# Patient Record
Sex: Female | Born: 1990 | Race: Black or African American | Hispanic: No | Marital: Single | Smoking: Never smoker
Health system: Southern US, Community
[De-identification: ages and names within clinical notes are randomized; demographics above are authoritative.]

## PROBLEM LIST (undated history)

## (undated) ENCOUNTER — Inpatient Hospital Stay (HOSPITAL_COMMUNITY): Payer: Self-pay

## (undated) DIAGNOSIS — Q613 Polycystic kidney, unspecified: Secondary | ICD-10-CM

## (undated) DIAGNOSIS — N200 Calculus of kidney: Secondary | ICD-10-CM

## (undated) HISTORY — DX: Calculus of kidney: N20.0

## (undated) HISTORY — PX: NO PAST SURGERIES: SHX2092

## (undated) HISTORY — DX: Polycystic kidney, unspecified: Q61.3

---

## 2017-04-05 NOTE — L&D Delivery Note (Addendum)
Patient: Kimberly Colon MRN: [id number]  GBS status: negative, IAP given NA  Patient is a 27 y.o. now G2P1011 s/p NSVD at [redacted]w[redacted]d, who was admitted for IOL 2/2 GHTN. SROM 2h 18m prior to delivery with clear fluid.    Delivery Note At 4:04 PM a viable female was delivered via Vaginal, Spontaneous (Presentation:vertex;OA).  APGAR: 8, 8; weight pending.   Placenta status:delivered spontaneously,intact.  Cord: 3-vessel with the following complications:none.  Cord pH: pending  Anesthesia:  Epidural inplace Episiotomy: None Lacerations: Labial Suture Repair: none required Est. Blood Loss (mL): 333  Head delivered OA. No nuchal cord present. Shoulder and body delivered in usual fashion. Infant with spontaneous cry, placed on mother's abdomen, dried and bulb suctioned. Cord clamped x 2 after 1-minute delay, and cut by family member. Cord blood drawn. Placenta delivered spontaneously with gentle cord traction. Fundus firm with massage and Pitocin. Perineum inspected and found to have minor shallow lacerations to labia which were found to be hemostatic- not requiring suture.   Mom to postpartum.  Baby to Couplet care / Skin to Skin.  Chelsey Geanie Berlin 03/20/2018, 4:29 PM   OB FELLOW DELIVERY ATTESTATION  I was gloved and present for the delivery in its entirety, and I agree with the above resident's note.    Phill Myron, D.O. OB Fellow  03/20/2018, 11:01 PM

## 2017-09-06 ENCOUNTER — Encounter: Payer: Self-pay | Admitting: *Deleted

## 2017-09-21 ENCOUNTER — Encounter: Payer: Self-pay | Admitting: Obstetrics and Gynecology

## 2017-09-21 ENCOUNTER — Other Ambulatory Visit (HOSPITAL_COMMUNITY)
Admission: RE | Admit: 2017-09-21 | Discharge: 2017-09-21 | Disposition: A | Discharge status: Home / Self Care | Payer: Medicaid Other | Source: Ambulatory Visit | Attending: Obstetrics and Gynecology | Admitting: Obstetrics and Gynecology

## 2017-09-21 ENCOUNTER — Ambulatory Visit (INDEPENDENT_AMBULATORY_CARE_PROVIDER_SITE_OTHER): Payer: Medicaid Other | Admitting: Obstetrics and Gynecology

## 2017-09-21 VITALS — BP 125/71 | HR 97 | Ht 67.0 in | Wt 152.1 lb

## 2017-09-21 DIAGNOSIS — Z3492 Encounter for supervision of normal pregnancy, unspecified, second trimester: Secondary | ICD-10-CM

## 2017-09-21 DIAGNOSIS — Z3490 Encounter for supervision of normal pregnancy, unspecified, unspecified trimester: Secondary | ICD-10-CM | POA: Diagnosis present

## 2017-09-21 DIAGNOSIS — O0992 Supervision of high risk pregnancy, unspecified, second trimester: Secondary | ICD-10-CM

## 2017-09-21 DIAGNOSIS — Z3481 Encounter for supervision of other normal pregnancy, first trimester: Secondary | ICD-10-CM | POA: Diagnosis not present

## 2017-09-21 DIAGNOSIS — Q613 Polycystic kidney, unspecified: Secondary | ICD-10-CM | POA: Insufficient documentation

## 2017-09-21 DIAGNOSIS — O0990 Supervision of high risk pregnancy, unspecified, unspecified trimester: Secondary | ICD-10-CM | POA: Insufficient documentation

## 2017-09-21 MED ORDER — ASPIRIN EC 81 MG PO TBEC: 81.0000 mg | DELAYED_RELEASE_TABLET | Freq: Every day | ORAL | 2 refills | Status: DC

## 2017-09-21 MED ORDER — MICONAZOLE NITRATE 2 % VA CREA: 1.0000 | TOPICAL_CREAM | Freq: Every day | VAGINAL | 2 refills | Status: DC

## 2017-09-21 NOTE — Progress Notes (Signed)
New OB Note  09/21/2017   Clinic: [facility]  Chief Complaint: NOB  Transfer of Care Patient: no  History of Present Illness: Ms. Severs is a 27 y.o. G2P0010 @ 12/5 weeks ([place], tentative at 12/27, based on Patient's last menstrual period was 06/24/2017 (exact date).).  Preg complicated by has Polycystic kidney disease and Supervision of high risk pregnancy, antepartum on their problem list.   Any events prior to today's visit: no Her periods were: qmonth, regular She was using no method when she conceived.  She has Positive signs or symptoms of nausea/vomiting of pregnancy. She has Negative signs or symptoms of miscarriage or preterm labor On any medications around the time she conceived/early pregnancy: No  ROS: A 12-point review of systems was performed and negative, except as stated in the above HPI.  OBGYN History: As per HPI. OB History  Gravida Para Term Preterm AB Living  2       1    SAB TAB Ectopic Multiple Live Births    1          # Outcome Date GA Lbr Len/2nd Weight Sex Delivery Anes PTL Lv  2 Current           1 TAB             Obstetric Comments  G1: 2016 EAB, pills    Any issues with any prior pregnancies: not applicable Prior children are healthy, doing well, and without any problems or issues: not applicable History of pap smears: No.   Past Medical History: Past Medical History:  Diagnosis Date  . Polycystic kidney disease   . Renal stone     Past Surgical History: History reviewed. No pertinent surgical history.  Family History:  History reviewed. No pertinent family history. She denies any female cancers, bleeding or blood clotting disorders.  She denies any history of mental retardation, birth defects or genetic disorders in her or the FOB's history except for her PCKD in the family  Social History:  Social History   Socioeconomic History  . Marital status: Single    Spouse name: Not on file  . Number of children:  Not on file  . Years of education: Not on file  . Highest education level: Not on file  Occupational History  . Not on file  Social Needs  . Financial resource strain: Not on file  . Food insecurity:    Worry: Not on file    Inability: Not on file  . Transportation needs:    Medical: Not on file    Non-medical: Not on file  Tobacco Use  . Smoking status: Not on file  Substance and Sexual Activity  . Alcohol use: Not on file  . Drug use: Not on file  . Sexual activity: Not on file  Lifestyle  . Physical activity:    Days per week: Not on file    Minutes per session: Not on file  . Stress: Not on file  Relationships  . Social connections:    Talks on phone: Not on file    Gets together: Not on file    Attends religious service: Not on file    Active member of club or organization: Not on file    Attends meetings of clubs or organizations: Not on file    Relationship status: Not on file  . Intimate partner violence:    Fear of current or ex partner: Not on file    Emotionally abused: Not  on file    Physically abused: Not on file    Forced sexual activity: Not on file  Other Topics Concern  . Not on file  Social History Narrative  . Not on file    Allergy: No Known Allergies  Health Maintenance:  Mammogram Up to Date: not applicable  Current Outpatient Medications: PNV, amoxicillin  Physical Exam:   BP 125/71   Pulse 97   Ht 5\' 7"  (1.702 m)   Wt 152 lb 1.6 oz (69 kg)   LMP 06/24/2017 (Exact Date)   BMI 23.82 kg/m  Body mass index is 23.82 kg/m. Contractions: Not present Vag. Bleeding: None. Fundal height: not applicable FHTs: 756E  General appearance: Well nourished, well developed female in no acute distress.  Neck:  Supple, normal appearance, and no thyromegaly  Cardiovascular: S1, S2 normal, no murmur, rub or gallop, regular rate and rhythm Respiratory:  Clear to auscultation bilateral. Normal respiratory effort Abdomen: positive bowel sounds and  no masses, hernias; diffusely non tender to palpation, non distended Breasts: pt denies any breast s/s Neuro/Psych:  Normal mood and affect.  Skin:  Warm and dry.  Lymphatic:  No inguinal lymphadenopathy.   Pelvic exam: is not limited by body habitus EGBUS: white cottage cheese like d/c and erythema on b/l labia majora, Vagina: +white CC d/c in vault, no blood in the vault, Cervix: normal appearing cervix without discharge or lesions, closed/long/high, Uterus:  enlarged, c/w 12-14 week size, and Adnexa:  normal adnexa and no mass, fullness, tenderness  Laboratory: none  Imaging:  none  Assessment: pt doing well  Plan: 1. Polycystic kidney disease Pt has questions re: this and has never seen genetics. [facility] notes reviewed.k will refer to genetics. D/w her increased risk of pre-eclampsia with it. Will get baseline labs today. F/u anatomy u/s. Start low dose aspirin today.  - Protein / creatinine ratio, urine - Comprehensive metabolic panel - AMB MFM GENETICS REFERRAL - Protein, urine, 24 hour - Creatinine, urine, 24 hour; Future - [place] MFM OB DETAIL +14 WK; Future  2. Encounter for supervision of low-risk pregnancy, antepartum - Culture, OB Urine - Cystic fibrosis gene test - Cytology - PAP - Genetic Screening - Hemoglobinopathy Evaluation - Obstetric Panel, Including HIV - SMN1 COPY NUMBER ANALYSIS (SMA Carrier Screen) - [place] MFM OB COMP + 14 WK; Future  3. Supervision of high risk pregnancy, antepartum Set up for anatomy u/s at 18-20wks. Monistat 7 sent in. Like from the amox she got for her strep throat. Offer afp nv. For panorama today. Finalize dating once she has an u/s. - Protein / creatinine ratio, urine - Comprehensive metabolic panel - AMB MFM GENETICS REFERRAL - Protein, urine, 24 hour - Creatinine, urine, 24 hour; Future - [place] MFM OB DETAIL +14 WK; Future  Problem list reviewed and updated.  Follow up in 3 weeks.  >50% of 25 min visit spent on counseling and  coordination of care.    Durene Romans MD Attending [facility])

## 2017-09-22 LAB — PROTEIN / CREATININE RATIO, URINE
CREATININE, UR: 83.5 mg/dL
PROTEIN UR: 5.3 mg/dL
Protein/Creat Ratio: 63 mg/g creat (ref 0–200)

## 2017-09-23 ENCOUNTER — Ambulatory Visit (HOSPITAL_COMMUNITY)
Admission: RE | Admit: 2017-09-23 | Discharge: 2017-09-23 | Disposition: A | Discharge status: Home / Self Care | Payer: Medicaid Other | Source: Ambulatory Visit | Attending: Obstetrics and Gynecology | Admitting: Obstetrics and Gynecology

## 2017-09-23 DIAGNOSIS — O352XX1 Maternal care for (suspected) hereditary disease in fetus, fetus 1: Secondary | ICD-10-CM

## 2017-09-23 DIAGNOSIS — Z3A13 13 weeks gestation of pregnancy: Secondary | ICD-10-CM | POA: Insufficient documentation

## 2017-09-23 LAB — CYTOLOGY - PAP
CHLAMYDIA, DNA PROBE: NEGATIVE
Diagnosis: NEGATIVE
NEISSERIA GONORRHEA: NEGATIVE

## 2017-09-23 LAB — URINE CULTURE, OB REFLEX

## 2017-09-23 LAB — CULTURE, OB URINE

## 2017-09-23 NOTE — Progress Notes (Signed)
Genetic Counseling  Visit Summary Note  Appointment Date: 09/23/2017 Referred By: Aletha Halim, MD  Date of Birth: 1990-11-29  Pregnancy history: G2P0010 Estimated Date of Delivery: 03/31/18 Estimated Gestational Age: [redacted]w[redacted]d I met with Ms. JObera Stauchfor genetic counseling because of a family history of polycystic kidney disease.  In summary:  Discussed family history of probable ADPKD  Reviewed ADPKD and dominant inheritance  Discussed 50% risk of recurrence  Discussed options of genetic testing for ADPKD  Offered PKD1 and PKD2 analysis-declined   Discussed option of prenatal diagnosis once a familial mutation is determined  Patient expressed that she would not be interested in amniocentesis even if a familial mutation was known  Discussed that fetuses with ADPKD rarely have cystic kidneys; a normal anatomy ultrasound therefore would not decrease the likelihood of ADPKD in the fetus  Reviewed routine screening for aneuploidy options- patient previously had NIPS (Panorama); results were pending at the time of today's appt.  Discussed general population carrier screening options  CF, SMA, and hemoglobinopathies-previously performed by referring provider; results pending at the time of today's appt  We began by reviewing the family history in detail. The [facility] setting of [facility] fever [facility]. CT of the abdomen at that time revealed bilateral polycystic kidneys, which was felt to be consistent with polycystic kidney disease (PKD). She was thought to have an infection of one of the renal cysts and symptoms improved with antibiotics. [facility] the [facility] She reported that she has been well since her  hospitalization in 2016.  The family histories were reviewed and found to be contributory for PKD. She reported that her mother has a history of PKD and is currently awaiting renal transplantation following a lengthy period on dialysis. The patient reported that her maternal grandfather, great uncle, and maternal great grandmother also have PKD. All of these relatives have undergone renal transplantation during their 434's The patient reported that she has two maternal half-siblings, who both had screening renal ultrasounds, given the family history, who are currently asymptomatic, but both have bilateral renal cysts. The remainder of both family histories were noncontributory for birth defects, intellectual disability, and known genetic conditions. Without further information regarding the provided family history, an accurate genetic risk cannot be calculated. Further genetic counseling is warranted if more information is obtained.  We discussed that the reported family history likely fits with autosomal dominant polycystic kidney disease (ADPKD), which is typically associated with onset of features during adulthood. The less common autosomal recessive form of PKD is typically associated with early onset. PKD is a common genetic disorder in adulthood with an incidence of about 1 in 1000. It is a systemic disease that is associated with renal system failure, liver disease, cerebral aneurysm, portal hypertension, dissection of the aorta and other heart problems.  PKD is generally a late-onset disorder characterized by progressive cyst development and bilaterally enlarged polycystic kidneys. These cysts can slowly replace much of the mass of the kidneys, reducing kidney function and leading to kidney failure. The condition is clinically variable, even within families.  Although rare, autosomal dominant PKD may present prenatally or in infancy. About 85% of cases of ADPKD result from an altered gene located on  chromosome 16 (PKD1).  The remaining 15% of cases are thought to result from an altered gene on chromosome 4 (PKD2). Genetic testing is clinically available but is most informative when began with individuals with a clinical diagnosis.   The diagnosis of autosomal dominant polycystic kidney disease is established primarily by renal imaging studies and genetic testing can be used to confirm or establish an uncertain diagnosis.  Diagnostic criteria has been established for renal sonography and it is thought that almost all (nearly 100%) of patients with the more common type of autosomal dominant PKD (PKD1) will meet this criteria by the age of 35 years or older.  We reviewed autosomal dominant inheritance where offspring of an affected individual have a 1 in 2 (50%) chance to inherit the condition. We discussed that given the reported family history, each of Kimberly Colon children will have a 50% chance of inheriting ADPKD. We discussed the option of confirmatory PKD1/PKD2 molecular genetic testing. Although the patient meets diagnostic criteria for ADPKD, if a specific gene alteration is identified, prenatal diagnosis would be available. We reviewed the option of amniocentesis, including the risks, benefits, and limitations of this testing. After thoughtful consideration of her options, the patient declined both molecular genetic testing for herself, and amniocentesis for prenatal diagnosis. She understands that the majority of fetuses with ADPKD will have normal appearing kidneys by detailed anatomy ultrasound. Therefore, a normal ultrasound does not reduce the likelihood of ADPKD in the fetus.   I reminded Kimberly Colon that although ADPKD is primarily associated with renal disease, this condition can affect other organ systems. I encouraged the patient to see a nephrologist and to discuss this condition in greater detail. She stated that she would call to reschedule with nephrology at [facility]. Without further  information regarding the provided family history, an accurate genetic risk cannot be calculated. Further genetic counseling is warranted if more information is obtained.  We also briefly reviewed general pregnancy screening options. Regarding screening for fetal aneuploidy, we discussed the age related risks and briefly reviewed chromosomes. The patient had NIPS (Panorama) on 09/21/17. The results were pending at the time of today's appointment. She is scheduled to have a detailed anatomy ultrasound on 11/09/17.   In addition, we reviewed the availability of screening for common autosomal recessive conditions, including: CF, SMA, and hemoglobinopathies. The patient had this screening performed through her referring provider's office on 09/21/17. The results were pending at the time of today's appointment.   Kimberly Colon denied exposure to environmental toxins or chemical agents. She denied the use of alcohol, tobacco or street drugs. She denied significant viral illnesses during the course of her pregnancy. Her medical and surgical histories were noncontributory.   I counseled this patient regarding the above risks and available options.  The approximate face-to-face time with the genetic counselor was 43 minutes.  Filbert Schilder, MS Certified Genetic Counselor

## 2017-09-24 LAB — PROTEIN, URINE, 24 HOUR
Protein, 24H Urine: 121 mg/24 hr (ref 30–150)
Protein, Ur: 8.2 mg/dL

## 2017-09-28 LAB — COMPREHENSIVE METABOLIC PANEL
ALT: 12 IU/L (ref 0–32)
AST: 12 IU/L (ref 0–40)
Albumin/Globulin Ratio: 1.6 (ref 1.2–2.2)
Albumin: 4.1 g/dL (ref 3.5–5.5)
Alkaline Phosphatase: 44 IU/L (ref 39–117)
BUN/Creatinine Ratio: 19 (ref 9–23)
BUN: 9 mg/dL (ref 6–20)
Bilirubin Total: <0.2 mg/dL (ref 0.0–1.2)
CALCIUM: 9.7 mg/dL (ref 8.7–10.2)
CO2: 22 mmol/L (ref 20–29)
CREATININE: 0.48 mg/dL — AB (ref 0.57–1.00)
Chloride: 103 mmol/L (ref 96–106)
GFR calc Af Amer: 158 mL/min/{1.73_m2} (ref >59)
GFR, EST NON AFRICAN AMERICAN: 137 mL/min/{1.73_m2} (ref >59)
GLOBULIN, TOTAL: 2.6 g/dL (ref 1.5–4.5)
Glucose: 78 mg/dL (ref 65–99)
POTASSIUM: 4.2 mmol/L (ref 3.5–5.2)
SODIUM: 140 mmol/L (ref 134–144)
Total Protein: 6.7 g/dL (ref 6.0–8.5)

## 2017-09-28 LAB — SMN1 COPY NUMBER ANALYSIS (SMA CARRIER SCREENING)

## 2017-09-28 LAB — OBSTETRIC PANEL, INCLUDING HIV
Antibody Screen: NEGATIVE
Basophils Absolute: 0 10*3/uL (ref 0.0–0.2)
Basos: 0 %
EOS (ABSOLUTE): 0.1 10*3/uL (ref 0.0–0.4)
EOS: 1 %
HEMOGLOBIN: 11.2 g/dL (ref 11.1–15.9)
HEP B S AG: NEGATIVE
HIV Screen 4th Generation wRfx: NONREACTIVE
Hematocrit: 32.9 % — ABNORMAL LOW (ref 34.0–46.6)
IMMATURE GRANS (ABS): 0 10*3/uL (ref 0.0–0.1)
IMMATURE GRANULOCYTES: 0 %
LYMPHS: 26 %
Lymphocytes Absolute: 2.1 10*3/uL (ref 0.7–3.1)
MCH: 29 pg (ref 26.6–33.0)
MCHC: 34 g/dL (ref 31.5–35.7)
MCV: 85 fL (ref 79–97)
MONOCYTES: 7 %
Monocytes Absolute: 0.6 10*3/uL (ref 0.1–0.9)
NEUTROS PCT: 66 %
Neutrophils Absolute: 5.2 10*3/uL (ref 1.4–7.0)
Platelets: 270 10*3/uL (ref 150–450)
RBC: 3.86 x10E6/uL (ref 3.77–5.28)
RDW: 15.4 % (ref 12.3–15.4)
RH TYPE: POSITIVE
RPR: NONREACTIVE
RUBELLA: 2.19 {index} (ref >0.99)
WBC: 7.9 10*3/uL (ref 3.4–10.8)

## 2017-09-28 LAB — HEMOGLOBINOPATHY EVALUATION
Ferritin: 60 ng/mL (ref 15–150)
HGB A: 97.6 % (ref 96.4–98.8)
HGB SOLUBILITY: NEGATIVE
Hgb A2 Quant: 2.4 % (ref 1.8–3.2)
Hgb C: 0 %
Hgb F Quant: 0 % (ref 0.0–2.0)
Hgb S: 0 %
Hgb Variant: 0 %

## 2017-09-28 LAB — CYSTIC FIBROSIS GENE TEST

## 2017-09-28 NOTE — Progress Notes (Signed)
09/28/17 Addendum: PMH completed

## 2017-10-12 ENCOUNTER — Encounter: Payer: Self-pay | Admitting: *Deleted

## 2017-10-31 ENCOUNTER — Telehealth: Payer: Self-pay | Admitting: General Practice

## 2017-10-31 NOTE — Telephone Encounter (Signed)
Patient called and left message on nurse voicemail line stating she has a follow up appt next Thursday but has had spotting with urination and her throat is really sore and swollen. Called patient and asked about bleeding. Patient reports seeing spotting once last night after she urinated but nothing since then. Told patient to keep an eye on things to make sure bleeding doesn't increase but decreased some spotting in early pregnancy can be common. Recommended she go to urgent care for evaluation of throat. Patient verbalized understanding & states she has had strep throat 2 times already in the past 2 months. Patient had no questions.

## 2017-11-01 ENCOUNTER — Emergency Department (HOSPITAL_COMMUNITY): Payer: 59

## 2017-11-01 ENCOUNTER — Encounter (HOSPITAL_COMMUNITY): Payer: Self-pay | Admitting: Emergency Medicine

## 2017-11-01 ENCOUNTER — Other Ambulatory Visit: Payer: Self-pay

## 2017-11-01 ENCOUNTER — Emergency Department (HOSPITAL_COMMUNITY)
Admission: EM | Admit: 2017-11-01 | Discharge: 2017-11-01 | Disposition: A | Discharge status: Home / Self Care | Payer: 59 | Attending: Emergency Medicine | Admitting: Emergency Medicine

## 2017-11-01 ENCOUNTER — Telehealth: Payer: Self-pay | Admitting: *Deleted

## 2017-11-01 DIAGNOSIS — B379 Candidiasis, unspecified: Secondary | ICD-10-CM

## 2017-11-01 DIAGNOSIS — O99512 Diseases of the respiratory system complicating pregnancy, second trimester: Secondary | ICD-10-CM | POA: Insufficient documentation

## 2017-11-01 DIAGNOSIS — Z3A17 17 weeks gestation of pregnancy: Secondary | ICD-10-CM | POA: Insufficient documentation

## 2017-11-01 DIAGNOSIS — B9689 Other specified bacterial agents as the cause of diseases classified elsewhere: Secondary | ICD-10-CM

## 2017-11-01 DIAGNOSIS — O9989 Other specified diseases and conditions complicating pregnancy, childbirth and the puerperium: Secondary | ICD-10-CM | POA: Diagnosis present

## 2017-11-01 DIAGNOSIS — O23592 Infection of other part of genital tract in pregnancy, second trimester: Secondary | ICD-10-CM | POA: Insufficient documentation

## 2017-11-01 DIAGNOSIS — N760 Acute vaginitis: Secondary | ICD-10-CM

## 2017-11-01 DIAGNOSIS — Z79899 Other long term (current) drug therapy: Secondary | ICD-10-CM | POA: Insufficient documentation

## 2017-11-01 DIAGNOSIS — O2312 Infections of bladder in pregnancy, second trimester: Secondary | ICD-10-CM | POA: Insufficient documentation

## 2017-11-01 DIAGNOSIS — J069 Acute upper respiratory infection, unspecified: Secondary | ICD-10-CM

## 2017-11-01 DIAGNOSIS — N3000 Acute cystitis without hematuria: Secondary | ICD-10-CM

## 2017-11-01 LAB — CBC WITH DIFFERENTIAL/PLATELET
ABS IMMATURE GRANULOCYTES: 0 10*3/uL (ref 0.0–0.1)
BASOS ABS: 0 10*3/uL (ref 0.0–0.1)
BASOS PCT: 0 %
Eosinophils Absolute: 0 10*3/uL (ref 0.0–0.7)
Eosinophils Relative: 0 %
HCT: 34.5 % — ABNORMAL LOW (ref 36.0–46.0)
HEMOGLOBIN: 11.4 g/dL — AB (ref 12.0–15.0)
IMMATURE GRANULOCYTES: 0 %
LYMPHS PCT: 13 %
Lymphs Abs: 1.1 10*3/uL (ref 0.7–4.0)
MCH: 29.5 pg (ref 26.0–34.0)
MCHC: 33 g/dL (ref 30.0–36.0)
MCV: 89.1 fL (ref 78.0–100.0)
Monocytes Absolute: 0.9 10*3/uL (ref 0.1–1.0)
Monocytes Relative: 11 %
NEUTROS ABS: 6.1 10*3/uL (ref 1.7–7.7)
NEUTROS PCT: 76 %
PLATELETS: 217 10*3/uL (ref 150–400)
RBC: 3.87 MIL/uL (ref 3.87–5.11)
RDW: 14.4 % (ref 11.5–15.5)
WBC: 8.1 10*3/uL (ref 4.0–10.5)

## 2017-11-01 LAB — URINALYSIS, ROUTINE W REFLEX MICROSCOPIC
Bilirubin Urine: NEGATIVE
GLUCOSE, UA: NEGATIVE mg/dL
Hgb urine dipstick: NEGATIVE
Ketones, ur: NEGATIVE mg/dL
Nitrite: NEGATIVE
PROTEIN: NEGATIVE mg/dL
Specific Gravity, Urine: 1.01 (ref 1.005–1.030)
pH: 7 (ref 5.0–8.0)

## 2017-11-01 LAB — ABO/RH: ABO/RH(D): AB POS

## 2017-11-01 LAB — COMPREHENSIVE METABOLIC PANEL
ALBUMIN: 3.2 g/dL — AB (ref 3.5–5.0)
ALT: 71 U/L — ABNORMAL HIGH (ref 0–44)
AST: 37 U/L (ref 15–41)
Alkaline Phosphatase: 59 U/L (ref 38–126)
Anion gap: 9 (ref 5–15)
BUN: 5 mg/dL — ABNORMAL LOW (ref 6–20)
CHLORIDE: 103 mmol/L (ref 98–111)
CO2: 22 mmol/L (ref 22–32)
CREATININE: 0.49 mg/dL (ref 0.44–1.00)
Calcium: 9.8 mg/dL (ref 8.9–10.3)
GFR calc non Af Amer: >60 mL/min (ref >60)
GLUCOSE: 94 mg/dL (ref 70–99)
Potassium: 3.7 mmol/L (ref 3.5–5.1)
SODIUM: 134 mmol/L — AB (ref 135–145)
Total Bilirubin: 0.7 mg/dL (ref 0.3–1.2)
Total Protein: 6.8 g/dL (ref 6.5–8.1)

## 2017-11-01 LAB — I-STAT BETA HCG BLOOD, ED (MC, WL, AP ONLY)

## 2017-11-01 LAB — WET PREP, GENITAL
Sperm: NONE SEEN
TRICH WET PREP: NONE SEEN

## 2017-11-01 LAB — GROUP A STREP BY PCR: Group A Strep by PCR: NOT DETECTED

## 2017-11-01 LAB — MONONUCLEOSIS SCREEN: Mono Screen: NEGATIVE

## 2017-11-01 LAB — HCG, QUANTITATIVE, PREGNANCY: hCG, Beta Chain, Quant, S: 95378 m[IU]/mL — ABNORMAL HIGH (ref <5)

## 2017-11-01 MED ORDER — METRONIDAZOLE 500 MG PO TABS: 500.0000 mg | ORAL_TABLET | Freq: Two times a day (BID) | ORAL | 0 refills | Status: AC

## 2017-11-01 MED ORDER — DOXYLAMINE-PYRIDOXINE 10-10 MG PO TBEC: 1.0000 | DELAYED_RELEASE_TABLET | Freq: Four times a day (QID) | ORAL | 0 refills | Status: DC | PRN

## 2017-11-01 MED ORDER — METRONIDAZOLE 500 MG PO TABS
500.0000 mg | ORAL_TABLET | Freq: Once | ORAL | Status: AC
  Administered 2017-11-01: 500 mg via ORAL
  Filled 2017-11-01: qty 1

## 2017-11-01 MED ORDER — SODIUM CHLORIDE 0.9 % IV SOLN
2.0000 g | Freq: Once | INTRAVENOUS | Status: AC
  Administered 2017-11-01: 2 g via INTRAVENOUS
  Filled 2017-11-01: qty 20

## 2017-11-01 MED ORDER — ACETAMINOPHEN 500 MG PO TABS
1000.0000 mg | ORAL_TABLET | Freq: Once | ORAL | Status: AC
  Administered 2017-11-01: 1000 mg via ORAL
  Filled 2017-11-01: qty 2

## 2017-11-01 MED ORDER — CLINDAMYCIN PHOSPHATE 900 MG/50ML IV SOLN
900.0000 mg | Freq: Once | INTRAVENOUS | Status: DC
  Filled 2017-11-01: qty 50

## 2017-11-01 MED ORDER — LACTATED RINGERS IV BOLUS
1000.0000 mL | Freq: Once | INTRAVENOUS | Status: AC
  Administered 2017-11-01: 1000 mL via INTRAVENOUS

## 2017-11-01 MED ORDER — AZITHROMYCIN 250 MG PO TABS: ORAL_TABLET | ORAL | 0 refills | Status: DC

## 2017-11-01 MED ORDER — CLOTRIMAZOLE 1 % VA CREA: 1.0000 | TOPICAL_CREAM | Freq: Every day | VAGINAL | 0 refills | Status: AC

## 2017-11-01 MED ORDER — AZITHROMYCIN 250 MG PO TABS
1000.0000 mg | ORAL_TABLET | Freq: Once | ORAL | Status: AC
  Administered 2017-11-01: 1000 mg via ORAL
  Filled 2017-11-01: qty 4

## 2017-11-01 MED ORDER — AZITHROMYCIN 250 MG PO TABS: 250.0000 mg | ORAL_TABLET | Freq: Every day | ORAL | 0 refills | Status: DC

## 2017-11-01 MED ORDER — CEPHALEXIN 500 MG PO CAPS: 500.0000 mg | ORAL_CAPSULE | Freq: Three times a day (TID) | ORAL | 0 refills | Status: AC

## 2017-11-01 NOTE — Telephone Encounter (Signed)
Kimberly Colon left a voicemail this am 8:22 stating she spoke to someone yesterday about her throat being really sore and headache. States was told to go to urgent care which she did- and they told her they won't see her because she is pregnant and told her to go to [facility]. States not sure what to do. I called [name] and she is already checked in to [facility] ER. I informed her that they will check her throat and then refer her back to [place] if needed

## 2017-11-01 NOTE — ED Provider Notes (Signed)
[place] EMERGENCY DEPARTMENT Provider Note   CSN: [id number] Arrival date & time: 11/01/17  3614     History   Chief Complaint Chief Complaint  Patient presents with  . Sore Throat  . Vaginal Bleeding    HPI Kimberly Colon is a 27 y.o. female.  HPI 27 year old female here with multiple complaints.  Patient's primary complaint is 2 to 3 days of sore throat, fever.  Symptoms started after she went to a conference.  She believes she may have had sick contacts at the conference and she was eating food during the day, despite not washing her hands frequently.  She states that her symptoms started with sore throat, chills, and subjective fevers.  She is had a mild cough but no sputum production.  She tried taking over-the-counter medications as well as leftover amoxicillin which has not improved her symptoms.  Her throat is aching, throbbing, worse with swallowing.  No alleviating factors.  Patient also reports that over the last 2 days, she has begun to have a scant amount of vaginal spotting.  She is had some intermittent lower abdominal pain.  She has an estimated [redacted] weeks pregnant and has not had an ultrasound that she is aware of to verify location.  The cramping seems to come and go.  She does have history of previous pregnancy that was terminated voluntarily.  No history of spontaneous abortions.  No history of PID or TOA.  No adnexal pain.  Past Medical History:  Diagnosis Date  . Polycystic kidney disease   . Renal stone     Patient Active Problem List   Diagnosis Date Noted  . [redacted] weeks gestation of pregnancy   . Hereditary disease in family possibly affecting fetus, fetus 8   . Supervision of high risk pregnancy, antepartum 09/21/2017  . Polycystic kidney disease     History reviewed. No pertinent surgical history.   OB History    Gravida  2   Para      Term      Preterm      AB  1   Living        SAB      TAB  1   Ectopic      Multiple      Live Births           Obstetric Comments  G1: 2016 EAB, pills         Home Medications    Prior to Admission medications   Medication Sig Start Date End Date Taking? Authorizing Provider  Prenatal Vit-Fe Fumarate-FA (PREPLUS) 27-1 MG TABS Take 1 tablet by mouth daily.   Yes [provider]  aspirin EC 81 MG tablet Take 1 tablet (81 mg total) by mouth daily. Patient not taking: Reported on 11/01/2017 09/21/17   Aletha Halim, MD  azithromycin (ZITHROMAX) 250 MG tablet Take 1 tablet (250 mg total) by mouth daily for 7 days. Take first 2 tablets together, then 1 every day until finished. 11/01/17 11/08/17  Duffy Bruce, MD  cephALEXin (KEFLEX) 500 MG capsule Take 1 capsule (500 mg total) by mouth 3 (three) times daily for 7 days. 11/01/17 11/08/17  Duffy Bruce, MD  clotrimazole (GYNE-LOTRIMIN) 1 % vaginal cream Place 1 Applicatorful vaginally at bedtime for 7 days. 11/01/17 11/08/17  Duffy Bruce, MD  metroNIDAZOLE (FLAGYL) 500 MG tablet Take 1 tablet (500 mg total) by mouth 2 (two) times daily for 7 days. 11/01/17 11/08/17  Duffy Bruce, MD  miconazole (  MONISTAT 7) 2 % vaginal cream Place 1 Applicatorful vaginally at bedtime. Apply for seven nights Patient not taking: Reported on 11/01/2017 09/21/17   Aletha Halim, MD    Family History No family history on file.  Social History Social History   Tobacco Use  . Smoking status: Never Smoker  . Smokeless tobacco: Never Used  Substance Use Topics  . Alcohol use: Not Currently  . Drug use: Not Currently     Allergies   Ibuprofen   Review of Systems Review of Systems  Constitutional: Positive for chills, fatigue and fever.  HENT: Positive for sore throat.   Genitourinary: Positive for vaginal bleeding.  Neurological: Positive for weakness.  All other systems reviewed and are negative.    Physical Exam Updated Vital Signs BP 119/78   Pulse 96   Temp 99.3 F (37.4 C)   Resp 16   Ht 5\' 7"   (1.702 m)   LMP 06/24/2017 (Exact Date)   SpO2 100%   BMI 23.82 kg/m   Physical Exam  Constitutional: She is oriented to person, place, and time. She appears well-developed and well-nourished. No distress.  HENT:  Head: Normocephalic and atraumatic.  Dry MM. Posterior pharyngeal erythema and tonsillar swelling.  Eyes: Conjunctivae are normal.  Neck: Neck supple.  Cardiovascular: Regular rhythm and normal heart sounds. Tachycardia present. Exam reveals no friction rub.  No murmur heard. Pulmonary/Chest: Effort normal and breath sounds normal. No tachypnea. No respiratory distress. She has no decreased breath sounds. She has no wheezes. She has no rales.  Abdominal: She exhibits no distension. There is no tenderness. There is no rigidity, no rebound and no guarding.  Genitourinary:  Genitourinary Comments: Marked vaginal mucosal edema diffusely, with thick white patches. Moderate yellow-green cervical discharge. Cervical friability, but no overt CMT.  Musculoskeletal: She exhibits no edema.  Neurological: She is alert and oriented to person, place, and time. She exhibits normal muscle tone.  Skin: Skin is warm. Capillary refill takes less than 2 seconds.  Psychiatric: She has a normal mood and affect.  Nursing note and vitals reviewed.    ED Treatments / Results  Labs (all labs ordered are listed, but only abnormal results are displayed) Labs Reviewed  WET PREP, GENITAL - Abnormal; Notable for the following components:      Result Value   Yeast Wet Prep HPF POC PRESENT (*)    Clue Cells Wet Prep HPF POC PRESENT (*)    WBC, Wet Prep HPF POC MANY (*)    All other components within normal limits  CBC WITH DIFFERENTIAL/PLATELET - Abnormal; Notable for the following components:   Hemoglobin 11.4 (*)    HCT 34.5 (*)    All other components within normal limits  COMPREHENSIVE METABOLIC PANEL - Abnormal; Notable for the following components:   Sodium 134 (*)    BUN 5 (*)    Albumin  3.2 (*)    ALT 71 (*)    All other components within normal limits  URINALYSIS, ROUTINE W REFLEX MICROSCOPIC - Abnormal; Notable for the following components:   APPearance CLOUDY (*)    Leukocytes, UA LARGE (*)    Bacteria, UA MANY (*)    All other components within normal limits  HCG, QUANTITATIVE, PREGNANCY - Abnormal; Notable for the following components:   hCG, Beta Chain, Quant, S 95,378 (*)    All other components within normal limits  I-STAT BETA HCG BLOOD, ED ([facility], [facility], [facility] ONLY) - Abnormal; Notable for the following components:  I-stat hCG, quantitative >2,000.0 (*)    All other components within normal limits  GROUP A STREP BY PCR  URINE CULTURE  MONONUCLEOSIS SCREEN  ABO/RH  GC/CHLAMYDIA PROBE AMP ([place]) NOT AT [facility]    EKG None  Radiology US Ob Limited > 14 Wks  Result Date: 11/01/2017 CLINICAL DATA:  Second trimester of pregnancy, vaginal bleeding. EXAM: LIMITED OBSTETRIC ULTRASOUND FINDINGS: Number of Fetuses: 1 Heart Rate:  152 bpm Movement: Yes Presentation: Cephalic Placental Location: Anterior Previa: No Amniotic Fluid (Subjective):  Within normal limits. BPD: 3.74 cm 17 w  3 d MATERNAL FINDINGS: Cervix:  Appears closed. Uterus/Adnexae: No abnormality visualized. Ovaries not clearly visualized. IMPRESSION: Single live intrauterine gestation of 17 weeks 3 days. This exam is performed on an emergent basis and does not comprehensively evaluate fetal size, dating, or anatomy; follow-up complete OB US should be considered if further fetal assessment is warranted. Electronically Signed   By: Marijo Conception, M.D.   On: 11/01/2017 13:32    Procedures Procedures (including critical care time)  Medications Ordered in ED Medications  lactated ringers bolus 1,000 mL (0 mLs Intravenous Stopped 11/01/17 1229)  lactated ringers bolus 1,000 mL (0 mLs Intravenous Stopped 11/01/17 1229)  acetaminophen (TYLENOL) tablet 1,000 mg (1,000 mg Oral Given 11/01/17 1052)  cefTRIAXone  (ROCEPHIN) 2 g in sodium chloride 0.9 % 100 mL IVPB (0 g Intravenous Stopped 11/01/17 1342)  azithromycin (ZITHROMAX) tablet 1,000 mg (1,000 mg Oral Given 11/01/17 1406)  metroNIDAZOLE (FLAGYL) tablet 500 mg (500 mg Oral Given 11/01/17 1406)     Initial Impression / Assessment and Plan / ED Course  I have reviewed the triage vital signs and the nursing notes.  Pertinent labs & imaging results that were available during my care of the patient were reviewed by me and considered in my medical decision making (see chart for details).  Clinical Course as of Nov 01 1517  Tue Nov 01, 2017  1052 27 yo G2P1 at estimated [redacted] wk GA here with sore throat, fever, congestion. Suspect URI versus infectious pharyngitis. No evidence of ludwig's, PTA, or RPA. Non-toxic. Will tx with clinda, IVF. Regarding her vag spotting - will check TVUS, U/S, UA. Pregnancy has not been documented as IUP.   [CI]    Clinical Course User Index [CI] Duffy Bruce, MD    Labs, imaging as above.  Patient without leukocytosis or significant electrolyte or renal abnormality.  Urinalysis does show significant pyuria and bacteria, though sample may be contaminated.  Patient with significant white blood cells, clue cells, and yeast on wet prep.  Negative trichomonas.  Ultrasound confirms IUP without acute complication.  Regarding the patient's URI symptoms, patient has known sick contacts and will treat supportively.  Regarding her intermittent vaginal pain and discharge, I have a low suspicion for PID clinically.  She has a normal white count and no uterine or abdominal tenderness.  I discussed the case with OB, who recommends azithromycin, Flagyl, and outpatient follow-up.  Given her otherwise well appearance, absence of any overt CMT, do not feel she needs admission and PID very unlikely in this trimester.  Return precautions given.  GC/C sent.  Final Clinical Impressions(s) / ED Diagnoses   Final diagnoses:  Acute vaginitis    Upper respiratory tract infection, unspecified type  Bacterial vaginosis  Yeast infection  Acute cystitis without hematuria    ED Discharge Orders        Ordered    azithromycin (ZITHROMAX) 250 MG tablet  Daily  11/01/17 1401    metroNIDAZOLE (FLAGYL) 500 MG tablet  2 times daily     11/01/17 1401    cephALEXin (KEFLEX) 500 MG capsule  3 times daily     11/01/17 1401    clotrimazole (GYNE-LOTRIMIN) 1 % vaginal cream  Daily at bedtime     11/01/17 1402       Duffy Bruce, MD 11/01/17 1519

## 2017-11-01 NOTE — ED Notes (Signed)
ED Provider at bedside. 

## 2017-11-01 NOTE — ED Notes (Signed)
Patient verbalizes understanding of discharge instructions. Opportunity for questioning and answers were provided. Armband removed by staff, pt discharged from ED.  

## 2017-11-01 NOTE — ED Triage Notes (Signed)
Pt. Stated, Im having a sore throat and Im [redacted] weeks pregnant and Im bleeding Im just spotting

## 2017-11-01 NOTE — ED Notes (Signed)
Family at bedside. 

## 2017-11-01 NOTE — ED Triage Notes (Signed)
Pt. Stated, I had some medicine , antibiotics left over from previous strep throat , I had 5 left.

## 2017-11-01 NOTE — ED Notes (Signed)
EDP at bedside for pelvic exam

## 2017-11-01 NOTE — ED Notes (Signed)
Patient transported to Ultrasound 

## 2017-11-02 LAB — GC/CHLAMYDIA PROBE AMP (~~LOC~~) NOT AT ARMC
Chlamydia: NEGATIVE
Neisseria Gonorrhea: NEGATIVE

## 2017-11-02 LAB — URINE CULTURE
Culture: NO GROWTH
SPECIAL REQUESTS: NORMAL

## 2017-11-03 ENCOUNTER — Ambulatory Visit (INDEPENDENT_AMBULATORY_CARE_PROVIDER_SITE_OTHER): Payer: 59 | Admitting: Advanced Practice Midwife

## 2017-11-03 VITALS — BP 116/71 | HR 99 | Wt 156.9 lb

## 2017-11-03 DIAGNOSIS — O98312 Other infections with a predominantly sexual mode of transmission complicating pregnancy, second trimester: Secondary | ICD-10-CM

## 2017-11-03 DIAGNOSIS — O0992 Supervision of high risk pregnancy, unspecified, second trimester: Secondary | ICD-10-CM

## 2017-11-03 DIAGNOSIS — A630 Anogenital (venereal) warts: Secondary | ICD-10-CM

## 2017-11-03 DIAGNOSIS — O0990 Supervision of high risk pregnancy, unspecified, unspecified trimester: Secondary | ICD-10-CM

## 2017-11-03 NOTE — Progress Notes (Signed)
   PRENATAL VISIT NOTE  Subjective:  Kimberly Colon is a 27 y.o. G2P0010 at [redacted]w[redacted]d being seen today for ongoing prenatal care.  She is currently monitored for the following issues for this high-risk pregnancy and has Polycystic kidney disease; Supervision of high risk pregnancy, antepartum; [redacted] weeks gestation of pregnancy; and Hereditary disease in family possibly affecting fetus, fetus 1 on their problem list.  Patient reports vaginal irritation.  Contractions: Not present. Vag. Bleeding: None.  Movement: Present. Denies leaking of fluid.   The following portions of the patient's history were reviewed and updated as appropriate: allergies, current medications, past family history, past medical history, past social history, past surgical history and problem list. Problem list updated.  Objective:   Vitals:   11/03/17 1004  BP: 116/71  Pulse: 99  Weight: 156 lb 14.4 oz (71.2 kg)    Fetal Status: Fetal Heart Rate (bpm): 147   Movement: Present     VS reviewed, nursing note reviewed,  Constitutional: well developed, well nourished, no distress HEENT: normocephalic CV: normal rate Pulm/chest wall: normal effort Abdomen: soft Neuro: alert and oriented x 3 Skin: warm, dry Psych: affect normal  On visual inspection 5-6 tiny (less than 0.5 cm raised flesh colored irregular in shape lesions on left inner labia/left vaginal wall), also thick white discharge clinging to vaginal walls and mild erythema of generalized external genitalia  Assessment and Plan:  Pregnancy: G2P0010 at [redacted]w[redacted]d  1. Genital warts complicating pregnancy, second trimester --New diagnosis made today. Dr Rip Harbour to room at [facility] request to confirm diagnosis. Pt tearful.  Questions answered.   --Discussed treatment options including expectant management, TCA treatment, and topical Aldara.  Warts are on vaginal mucosa so Aldara not recommended.  --Pt desires treatment with TCA today. --5-6 warts on left labia/left vaginal  wall treated today.  Baking soda/water mixture applied after treatment for pt comfort. Pt tolerated well.  Recommend Tylenol 1000 mg PO x 1 dose and then Q 6 PRN for discomfort. --Continue topical treatment for yeast infection started yesterday --Continue Abx for URI, UTI, and BV as prescribed  2. Supervision of high risk pregnancy, antepartum --Due to polycystic kidney disease. Stable.  Preterm labor symptoms and general obstetric precautions including but not limited to vaginal bleeding, contractions, leaking of fluid and fetal movement were reviewed in detail with the patient. Please refer to After Visit Summary for other counseling recommendations.  Return in about 2 weeks (around 11/17/2017).  Future Appointments  Date Time Provider [place]  11/09/2017 10:45 AM WH-MFC [place] 2 WH-MFCUS MFC-US    Fatima Blank, CNM

## 2017-11-09 ENCOUNTER — Other Ambulatory Visit: Payer: Self-pay | Admitting: Obstetrics and Gynecology

## 2017-11-09 ENCOUNTER — Encounter (HOSPITAL_COMMUNITY): Payer: Self-pay

## 2017-11-09 ENCOUNTER — Ambulatory Visit (HOSPITAL_COMMUNITY)
Admission: RE | Admit: 2017-11-09 | Discharge: 2017-11-09 | Disposition: A | Discharge status: Home / Self Care | Payer: Medicaid Other | Source: Ambulatory Visit | Attending: Obstetrics and Gynecology | Admitting: Obstetrics and Gynecology

## 2017-11-09 ENCOUNTER — Other Ambulatory Visit (HOSPITAL_COMMUNITY): Payer: Self-pay | Admitting: *Deleted

## 2017-11-09 DIAGNOSIS — Z363 Encounter for antenatal screening for malformations: Secondary | ICD-10-CM

## 2017-11-09 DIAGNOSIS — O26832 Pregnancy related renal disease, second trimester: Secondary | ICD-10-CM | POA: Diagnosis not present

## 2017-11-09 DIAGNOSIS — O321XX0 Maternal care for breech presentation, not applicable or unspecified: Secondary | ICD-10-CM | POA: Diagnosis not present

## 2017-11-09 DIAGNOSIS — Q613 Polycystic kidney, unspecified: Secondary | ICD-10-CM

## 2017-11-09 DIAGNOSIS — Z3A19 19 weeks gestation of pregnancy: Secondary | ICD-10-CM

## 2017-11-09 DIAGNOSIS — O2692 Pregnancy related conditions, unspecified, second trimester: Secondary | ICD-10-CM

## 2017-11-09 DIAGNOSIS — Q612 Polycystic kidney, adult type: Secondary | ICD-10-CM | POA: Insufficient documentation

## 2017-11-09 DIAGNOSIS — O10919 Unspecified pre-existing hypertension complicating pregnancy, unspecified trimester: Secondary | ICD-10-CM

## 2017-11-09 DIAGNOSIS — O0992 Supervision of high risk pregnancy, unspecified, second trimester: Secondary | ICD-10-CM | POA: Diagnosis not present

## 2017-11-09 DIAGNOSIS — O0990 Supervision of high risk pregnancy, unspecified, unspecified trimester: Secondary | ICD-10-CM

## 2017-11-24 ENCOUNTER — Encounter (HOSPITAL_COMMUNITY): Payer: Self-pay | Admitting: *Deleted

## 2017-11-24 ENCOUNTER — Inpatient Hospital Stay (HOSPITAL_COMMUNITY)
Admission: AD | Admit: 2017-11-24 | Discharge: 2017-11-24 | Disposition: A | Discharge status: Home / Self Care | Payer: 59 | Source: Ambulatory Visit | Attending: Obstetrics and Gynecology | Admitting: Obstetrics and Gynecology

## 2017-11-24 ENCOUNTER — Inpatient Hospital Stay (HOSPITAL_BASED_OUTPATIENT_CLINIC_OR_DEPARTMENT_OTHER): Payer: 59

## 2017-11-24 ENCOUNTER — Other Ambulatory Visit: Payer: Self-pay

## 2017-11-24 DIAGNOSIS — B3731 Acute candidiasis of vulva and vagina: Secondary | ICD-10-CM

## 2017-11-24 DIAGNOSIS — O4692 Antepartum hemorrhage, unspecified, second trimester: Secondary | ICD-10-CM

## 2017-11-24 DIAGNOSIS — O368120 Decreased fetal movements, second trimester, not applicable or unspecified: Secondary | ICD-10-CM | POA: Insufficient documentation

## 2017-11-24 DIAGNOSIS — O98812 Other maternal infectious and parasitic diseases complicating pregnancy, second trimester: Secondary | ICD-10-CM | POA: Diagnosis not present

## 2017-11-24 DIAGNOSIS — Z3A21 21 weeks gestation of pregnancy: Secondary | ICD-10-CM | POA: Insufficient documentation

## 2017-11-24 DIAGNOSIS — O2692 Pregnancy related conditions, unspecified, second trimester: Secondary | ICD-10-CM | POA: Diagnosis not present

## 2017-11-24 DIAGNOSIS — B373 Candidiasis of vulva and vagina: Secondary | ICD-10-CM | POA: Insufficient documentation

## 2017-11-24 DIAGNOSIS — Z7982 Long term (current) use of aspirin: Secondary | ICD-10-CM | POA: Insufficient documentation

## 2017-11-24 DIAGNOSIS — O26852 Spotting complicating pregnancy, second trimester: Secondary | ICD-10-CM | POA: Insufficient documentation

## 2017-11-24 DIAGNOSIS — O0990 Supervision of high risk pregnancy, unspecified, unspecified trimester: Secondary | ICD-10-CM

## 2017-11-24 LAB — URINALYSIS, ROUTINE W REFLEX MICROSCOPIC
Bilirubin Urine: NEGATIVE
Glucose, UA: NEGATIVE mg/dL
Ketones, ur: NEGATIVE mg/dL
Nitrite: NEGATIVE
Protein, ur: NEGATIVE mg/dL
Specific Gravity, Urine: 1.016 (ref 1.005–1.030)
pH: 7 (ref 5.0–8.0)

## 2017-11-24 LAB — WET PREP, GENITAL
Clue Cells Wet Prep HPF POC: NONE SEEN
Sperm: NONE SEEN
Trich, Wet Prep: NONE SEEN

## 2017-11-24 MED ORDER — TERCONAZOLE 0.4 % VA CREA: 1.0000 | TOPICAL_CREAM | Freq: Every day | VAGINAL | 0 refills | Status: DC

## 2017-11-24 NOTE — MAU Provider Note (Addendum)
History     CSN: [id number]  Arrival date and time: 11/24/17 1749   First Provider Initiated Contact with Patient 11/24/17 1853      Chief Complaint  Patient presents with  . Vaginal Bleeding  . Decreased Fetal Movement   HPI  Ms. Kimberly Colon is a 27 y.o. G2P0010 at [redacted]w[redacted]d who presents to MAU today with complaint of spotting x 2 days and decreased fetal movement. The patient denies abdominal pain. She denies LOF. She states that she has only felt some minimal movement so far in this pregnancy. She denies complications with the pregnancy.   OB History    Gravida  2   Para      Term      Preterm      AB  1   Living        SAB      TAB  1   Ectopic      Multiple      Live Births           Obstetric Comments  G1: 2016 EAB, pills        Past Medical History:  Diagnosis Date  . Polycystic kidney disease   . Renal stone     History reviewed. No pertinent surgical history.  History reviewed. No pertinent family history.  Social History   Tobacco Use  . Smoking status: Never Smoker  . Smokeless tobacco: Never Used  Substance Use Topics  . Alcohol use: Not Currently  . Drug use: Not Currently    Allergies:  Allergies  Allergen Reactions  . Ibuprofen     Kidneys not able to process     Medications Prior to Admission  Medication Sig Dispense Refill Last Dose  . aspirin EC 81 MG tablet Take 1 tablet (81 mg total) by mouth daily. (Patient not taking: Reported on 11/01/2017) 60 tablet 2 Not Taking  . azithromycin (ZITHROMAX) 250 MG tablet Take first 2 tablets together, then 1 every day until finished for a total of 7 days of treatment. (Patient not taking: Reported on 11/03/2017) 8 tablet 0 Not Taking  . Doxylamine-Pyridoxine 10-10 MG TBEC Take 1 tablet by mouth every 6 (six) hours as needed (nausea, vomiting). (Patient not taking: Reported on 11/09/2017) 30 tablet 0 Not Taking  . miconazole (MONISTAT 7) 2 % vaginal cream Place 1 Applicatorful  vaginally at bedtime. Apply for seven nights (Patient not taking: Reported on 11/01/2017) 30 g 2 Not Taking  . Prenatal Vit-Fe Fumarate-FA (PREPLUS) 27-1 MG TABS Take 1 tablet by mouth daily.   Taking    Review of Systems  Constitutional: Negative for fever.  Gastrointestinal: Negative for abdominal pain, constipation, diarrhea, nausea and vomiting.  Genitourinary: Positive for vaginal bleeding. Negative for dysuria, frequency, urgency and vaginal discharge.   Physical Exam   Blood pressure 125/75, pulse 85, temperature 98.2 F (36.8 C), temperature source Oral, resp. rate 16, weight 75.3 kg, last menstrual period 06/24/2017, SpO2 100 %.  Physical Exam  Nursing note and vitals reviewed. Constitutional: She is oriented to person, place, and time. She appears well-developed and well-nourished. No distress.  HENT:  Head: Normocephalic and atraumatic.  Cardiovascular: Normal rate.  Respiratory: Effort normal.  GI: Soft. She exhibits no distension. There is no tenderness.  Neurological: She is alert and oriented to person, place, and time.  Skin: Skin is warm and dry. No erythema.  Psychiatric: She has a normal mood and affect.    MAU Course  Procedures None  MDM [place] today prior to vaginal exam since there was no information about placenta on most recent US 2000 - Patient in [place]. Care turned over to Darrol Poke, CNM  Kerry Hough, PA-C 11/24/2017, 7:51 PM   [place] reviewed  [place] Mfm Ob Limited  Result Date: 11/25/2017 ----------------------------------------------------------------------  OBSTETRICS REPORT                       (Signed Final 11/25/2017 01:44 pm) ---------------------------------------------------------------------- Patient Info  ID #:       [id number]                          D.O.B.:  25-Sep-1990 (27 yrs)  Name:       Kimberly Colon                 Visit Date: 11/24/2017 07:03 pm ---------------------------------------------------------------------- Performed By  Performed  By:     Hubert Azure          Ref. Address:     [address]                    [place], [place]  Attending:        Tama High MD        Location:         [facility]  Referred By:      Aletha Halim MD ---------------------------------------------------------------------- Orders   #  Description                          Code         Ordered By   1  [place] MFM OB LIMITED                    11941.74     Kerry Hough  ----------------------------------------------------------------------   #  Order #                    Accession #                 Episode #   1  [id number]                  [id number]  [id number]  ---------------------------------------------------------------------- Indications   [redacted] weeks gestation of pregnancy                Z3A.21   Vaginal bleeding in pregnancy, second          O46.92   trimester   Medical complication of pregnancy              O26.90   (polycystic kidney disease)  ---------------------------------------------------------------------- Vital Signs                                                 Height:        5'7" ---------------------------------------------------------------------- Fetal Evaluation  Num Of Fetuses:         1  Fetal Heart Rate(bpm):  155  Cardiac Activity:       Observed  Presentation:           Cephalic  Placenta:               Anterior  P. Cord Insertion:      Visualized, central  Amniotic Fluid  AFI FV:      Within normal limits                              Largest Pocket(cm)                              5.59  Comment:    Stomach, bladder, and diaphragm seen. No placental abruption              or previa identified. ---------------------------------------------------------------------- OB History  Gravidity:    2         Term:   0         Prem:   0        SAB:   0  TOP:          1       Ectopic:  0        Living: 0 ---------------------------------------------------------------------- Gestational Age  LMP:           21w 6d        Date:  06/24/17                 EDD:   03/31/18  Best:          Audrea Muscat 6d     Det. By:  LMP  (06/24/17)          EDD:   03/31/18 ---------------------------------------------------------------------- Cervix Uterus Adnexa  Cervix  Length:           3.93  cm.  Normal appearance by transabdominal scan.  Uterus  No abnormality visualized.  Left Ovary  Not visualized.  Right Ovary  Not visualized.  Adnexa  No abnormality visualized. ---------------------------------------------------------------------- Impression  Patient was evaluated for c/o vaginal spotting.  Amniotic fluid is normal and good fetal activity is seen.  Placenta appears normal. On transabdominal scan, the  cervix looks long and closed. ----------------------------------------------------------------------                  Tama High, MD Electronically Signed Final Report   11/25/2017 01:44 pm ----------------------------------------------------------------------  Patient reports feeling fetal movement, discussed with patient the results of [place] with placenta being  anterior and possibility of not feeling as much movement until placenta moves up as her uterus grows- patient verbalizes understanding.   Vaginal examination noted closed cervix with no vaginal bleeding  Dilation: Closed Effacement (%): Thick Cervical Position: Posterior Exam by:: Neil Brickell, CNM  Wet prep positive for yeast, will treat with Terazol Rx  GC/C pending  Urine culture pending  Assessment and Plan   1. Decreased fetal movements in second trimester, single or unspecified fetus   2. Supervision of high risk pregnancy, antepartum   3. Spotting affecting pregnancy in second trimester   4. [redacted] weeks gestation of pregnancy   5. Vaginal yeast infection    Discharge home   Urine culture pending- will follow up with results  Rx for Terazol  Message sent to clinic to schedule patient [place] appointment  Discussed reasons to return to MAU  Pelvic rest   Lajean Manes, CNM 11/24/17, 9:40 PM

## 2017-11-24 NOTE — MAU Note (Signed)
Saw blood when she wiped yesterday and today.  Called md, they asked about fetal movement, she isn't really sure if she has felt it. Thought she had felt it, doesn't think she has felt it in a wk. No placental issues on [place]

## 2017-11-24 NOTE — MAU Note (Signed)
Urine in lab 

## 2017-11-25 LAB — GC/CHLAMYDIA PROBE AMP (~~LOC~~) NOT AT ARMC
Chlamydia: NEGATIVE
Neisseria Gonorrhea: NEGATIVE

## 2017-11-26 LAB — CULTURE, OB URINE: Culture: <10000 — AB

## 2017-12-06 ENCOUNTER — Ambulatory Visit (INDEPENDENT_AMBULATORY_CARE_PROVIDER_SITE_OTHER): Payer: 59 | Admitting: Nurse Practitioner

## 2017-12-06 VITALS — BP 119/80 | HR 96 | Wt 167.1 lb

## 2017-12-06 DIAGNOSIS — O0993 Supervision of high risk pregnancy, unspecified, third trimester: Secondary | ICD-10-CM

## 2017-12-06 DIAGNOSIS — Q613 Polycystic kidney, unspecified: Secondary | ICD-10-CM

## 2017-12-06 DIAGNOSIS — O0990 Supervision of high risk pregnancy, unspecified, unspecified trimester: Secondary | ICD-10-CM

## 2017-12-06 NOTE — Progress Notes (Signed)
    Subjective:  Kimberly Colon is a 27 y.o. G2P0010 at [redacted]w[redacted]d being seen today for ongoing prenatal care.  She is currently monitored for the following issues for this high-risk pregnancy and has Polycystic kidney disease; Supervision of high risk pregnancy, antepartum; [redacted] weeks gestation of pregnancy; and Hereditary disease in family possibly affecting fetus, fetus 1 on their problem list.  Patient reports occasional spotting continues since hospital visit to have spotting checked  Contractions: Not present. Vag. Bleeding: Scant.  Movement: Present. Denies leaking of fluid.   The following portions of the patient's history were reviewed and updated as appropriate: allergies, current medications, past family history, past medical history, past social history, past surgical history and problem list. Problem list updated.  Objective:   Vitals:   12/06/17 0950  BP: 119/80  Pulse: 96  Weight: 167 lb 1.6 oz (75.8 kg)    Fetal Status: Fetal Heart Rate (bpm): 154 Fundal Height: 28 cm Movement: Present     General:  Alert, oriented and cooperative. Patient is in no acute distress.  Skin: Skin is warm and dry. No rash noted.   Cardiovascular: Normal heart rate noted  Respiratory: Normal respiratory effort, no problems with respiration noted  Abdomen: Soft, gravid, appropriate for gestational age. Pain/Pressure: Present     Pelvic:  Cervical exam deferred        Extremities: Normal range of motion.  Edema: Trace  Mental Status: Normal mood and affect. Normal behavior. Normal judgment and thought content.   Urinalysis:      Assessment and Plan:  Pregnancy: G2P0010 at [redacted]w[redacted]d  1. Supervision of high risk pregnancy, antepartum Has known polycystic kidney and BP today is fine. Discussed contraception briefly - has always used condoms but this was an unplanned pregnancy.  Somewhat interested in IUD but did not get brochure today. Advised childbirth classes and breastfeeding classes. Has been  advised to take low dose aspirin several times.  States she has it but forgets to take it.  Continued to encourage her to take the low dose aspirin and she says she will start it today.  Preterm labor symptoms and general obstetric precautions including but not limited to vaginal bleeding, contractions, leaking of fluid and fetal movement were reviewed in detail with the patient. Please refer to After Visit Summary for other counseling recommendations.  Return in about 4 weeks (around 01/03/2018) for [facility] - will need glucola at this visit.  Earlie Server, RN, MSN, NP-BC Nurse Practitioner, [facility] for [employer], [facility] 12/06/2017 10:19 AM

## 2017-12-06 NOTE — Patient Instructions (Addendum)
[email or web address].org  - website for contraception  Childbirth Education Options: [facility] Department Classes:  Childbirth education classes can help you get ready for a positive parenting experience. You can also meet other expectant parents and get free stuff for your baby. Each class runs for five weeks on the same night and costs $45 for the mother-to-be and her support person. Medicaid covers the cost if you are eligible. Call [phone] to register. [facility] Childbirth Education:  [phone] or [phone] or [email or web address]  Baby & Me Class: Discuss newborn & infant parenting and family adjustment issues with other new mothers in a relaxed environment. Each week brings a new speaker or baby-centered activity. We encourage new mothers to join [place] every Thursday at 11:00am. Babies birth until crawling. No registration or fee. Daddy [employer]: This course offers Dads-to-be the tools and knowledge needed to feel confident on their journey to becoming new fathers. Experienced dads, who have been trained as coaches, teach dads-to-be how to hold, comfort, diaper, swaddle and play with their infant while being able to support the new mom as well. A class for men taught by men. $25/dad Big Brother/Big Sister: Let your children share in the joy of a new brother or sister in this special class designed just for them. Class includes discussion about how families care for babies: swaddling, holding, diapering, safety as well as how they can be helpful in their new role. This class is designed for children ages 36 to 59, but any age is welcome. Please register each child individually. $5/child  Mom Talk: This mom-led group offers support and connection to mothers as they journey through the adjustments and struggles of that sometimes overwhelming first year after the birth of a child. Tuesdays at 10:00am and Thursdays at 6:00pm. Babies welcome. No registration or fee. Breastfeeding  Support Group: This group is a mother-to-mother support circle where moms have the opportunity to share their breastfeeding experiences. A Lactation Consultant is present for questions and concerns. Meets each Tuesday at 11:00am. No fee or registration. Breastfeeding Your Baby: Learn what to expect in the first days of breastfeeding your newborn.  This class will help you feel more confident with the skills needed to begin your breastfeeding experience. Many new mothers are concerned about breastfeeding after leaving the hospital. This class will also address the most common fears and challenges about breastfeeding during the first few weeks, months and beyond. (call for fee) Comfort Techniques and Tour: This 2 hour interactive class will provide you the opportunity to learn & practice hands-on techniques that can help relieve some of the discomfort of labor and encourage your baby to rotate toward the best position for birth. You and your partner will be able to try a variety of labor positions with birth balls and rebozos as well as practice breathing, relaxation, and visualization techniques. A tour of the [facility] is included with this class. $20 per registrant and support person Childbirth Class- Weekend Option: This class is a Weekend version of our Birth & Baby series. It is designed for parents who have a difficult time fitting several weeks of classes into their schedule. It covers the care of your newborn and the basics of labor and childbirth. It also includes a [place] [facility] and lunch. The class is held two consecutive days: beginning on Friday evening from 6:30 - 8:30 p.m. and the next day, Saturday from 9 a.m. - 4 p.m. (call for fee) Doren Custard  Class: Interested in a waterbirth?  This informational class will help you discover whether waterbirth is the right fit for you. Education about waterbirth itself, supplies you would need and how  to assemble your support team is what you can expect from this class. Some obstetrical practices require this class in order to pursue a waterbirth. (Not all obstetrical practices offer waterbirth-check with your healthcare provider.) Register only the expectant mom, but you are encouraged to bring your partner to class! Required if planning waterbirth, no fee. Infant/Child CPR: Parents, grandparents, babysitters, and friends learn Cardio-Pulmonary Resuscitation skills for infants and children. You will also learn how to treat both conscious and unconscious choking in infants and children. This Family & Friends program does not offer certification. Register each participant individually to ensure that enough mannequins are available. (Call for fee) Grandparent Love: Expecting a grandbaby? This class is for you! Learn about the latest infant care and safety recommendations and ways to support your own child as he or she transitions into the parenting role. Taught by Registered Nurses who are childbirth instructors, but most importantly...they are grandmothers too! $10/person. Childbirth Class- Natural Childbirth: This series of 5 weekly classes is for expectant parents who want to learn and practice natural methods of coping with the process of labor and childbirth. Relaxation, breathing, massage, visualization, role of the partner, and helpful positioning are highlighted. Participants learn how to be confident in their body's ability to give birth. This class will empower and help parents make informed decisions about their own care. Includes discussion that will help new parents transition into the immediate postpartum period. [facility] is included. We suggest taking this class between 25-32 weeks, but it's only a recommendation. $75 per registrant and one support person or $30 Medicaid. Childbirth Class- 3 week Series: This option of 3 weekly classes helps you and your  labor partner prepare for childbirth. Newborn care, labor & birth, cesarean birth, pain management, and comfort techniques are discussed and a [place] [facility] is included. The class meets at the same time, on the same day of the week for 3 consecutive weeks beginning with the starting date you choose. $60 for registrant and one support person.  Marvelous Multiples: Expecting twins, triplets, or more? This class covers the differences in labor, birth, parenting, and breastfeeding issues that face multiples' parents. NICU tour is included. Led by a Certified Childbirth Educator who is the mother of twins. No fee. Caring for Baby: This class is for expectant and adoptive parents who want to learn and practice the most up-to-date newborn care for their babies. Focus is on birth through the first six weeks of life. Topics include feeding, bathing, diapering, crying, umbilical cord care, circumcision care and safe sleep. Parents learn to recognize symptoms of illness and when to call the pediatrician. Register only the mom-to-be and your partner or support person can plan to come with you! $10 per registrant and support person Childbirth Class- online option: This online class offers you the freedom to complete a Birth and Baby series in the comfort of your own home. The flexibility of this option allows you to review sections at your own pace, at times convenient to you and your support people. It includes additional video information, animations, quizzes, and extended activities. Get organized with helpful eClass tools, checklists, and trackers. Once you register online for the class, you will receive an email within a few days to accept the invitation  and begin the class when the time is right for you. The content will be available to you for 60 days. $60 for 60 days of online access for you and your support people.  Local Doulas: Natural Baby  Doulas [email or web address] Tel: [phone] [email or web address] [employer] [phone] [email or web address] The Labor Hassell Halim  (also do waterbirth tub rental) [phone] [email or web address] Triad Birth Doula [phone] [email or web address] [email or web address].fi Sacred Rhythms  [phone] https://[email or web address]-[email or web address] [employer] (PADA) [email or web address] [place] Birth and Baby  [email or web address] Considering Waterbirth? Guide for patients at [facility] for [employer]  Why consider waterbirth?  . Gentle birth for babies . Less pain medicine used in labor . May allow for passive descent/less pushing . May reduce perineal tears  . More mobility and instinctive maternal position changes . Increased maternal relaxation . Reduced blood pressure in labor  Is waterbirth safe? What are the risks of infection, drowning or other complications?  . Infection: o Very low risk (3.7 % for tub vs 4.8% for bed) o 7 in 8000 waterbirths with documented infection o Poorly cleaned equipment most common cause o Slightly lower group B strep transmission rate  . Drowning o Maternal:  - Very low risk   - Related to seizures or fainting o Newborn:  - Very low risk. No evidence of increased risk of respiratory problems in multiple large studies - Physiological protection from breathing under water - Avoid underwater birth if there are any fetal complications - Once baby's head is out of the water, keep it out.  . Birth complication o Some reports of cord trauma, but risk decreased by bringing baby to surface gradually o No evidence of increased risk of shoulder dystocia. Mothers can usually change positions faster in water than in a bed, possibly aiding the maneuvers to free the shoulder.   You  must attend a Doren Custard class at [facility]  3rd Wednesday of every month from 7-9pm  [employer] by calling [phone] or online at [email or web address]  Bring [place] the certificate from the class to your prenatal appointment  Meet with a midwife at 36 weeks to see if you can still plan a waterbirth and to sign the consent.   Purchase or rent the following supplies:   Water Birth Pool (Birth Pool in a Box or [place] for instance)  (Tubs start ~$125)  Single-use disposable tub liner designed for your brand of tub  New garden hose labeled "lead-free", "suitable for drinking water",  Electric drain pump to remove water (We recommend 792 gallon per hour or greater pump.)   Separate garden hose to remove the dirty water  Fish net  Bathing suit top (optional)  Long-handled mirror (optional)  Places to purchase or rent supplies  GotWebTools.is for tub purchases and supplies  Waterbirthsolutions.com for tub purchases and supplies  The Labor Ladies (www.[email or web address]) $275 for tub rental/set-up & take down/kit   [employer] (http://www.fleming.com/.htm) Information regarding doulas (labor support) who provide pool rentals  Our practice has a Birth Pool in a Box tub at the hospital that you may borrow on a first-come-first-served basis. It is your responsibility to to set up, clean and break down the tub. We cannot guarantee the availability of this tub in advance. You are responsible for bringing all accessories listed above. If you do not have all necessary supplies you cannot have a waterbirth.    Things that would prevent you from having a waterbirth:  Premature, <37wks  Previous cesarean birth  Presence of  thick meconium-stained fluid  Multiple gestation (Twins, triplets, etc.)  Uncontrolled diabetes or gestational diabetes requiring medication  Hypertension requiring medication or diagnosis of pre-eclampsia  Heavy  vaginal bleeding  Non-reassuring fetal heart rate  Active infection (MRSA, etc.). Group B Strep is NOT a contraindication for  waterbirth.  If your labor has to be induced and induction method requires continuous  monitoring of the baby's heart rate  Other risks/issues identified by your obstetrical provider  Please remember that birth is unpredictable. Under certain unforeseeable circumstances your provider may advise against giving birth in the tub. These decisions will be made on a case-by-case basis and with the safety of you and your baby as our highest priority.

## 2017-12-29 ENCOUNTER — Telehealth: Payer: Self-pay | Admitting: General Practice

## 2017-12-29 NOTE — Telephone Encounter (Signed)
Patient called and left message on nurse voicemail line stating she is [redacted] weeks pregnant and started to bleed yesterday. Patient states she went to the hospital in [facility] yesterday because she was out of town and everything checked out okay but she started to bleed again this morning. Called patient and she states she went to the hospital last night and they checked out everything and baby was okay. Patient states she noticed a fair amount of blood after pooping and went to the hospital. Patient states they told her it could be hemorrhoids because she has been constipated or it could be her vagina is irritated because she has a yeast infection. Patient states she was given two prescriptions and sent home. Patient states this morning she noticed spotting after urinating but none since then. Patient denies recent sexual intercourse. Discussed with patient it is likely coming from one of the suggestions they made in the ER. Reassured patient that baby checked out okay and everything looked fine. Patient reports good fetal movement. Discussed she should return to [facility] for increased bleeding or decreased fetal movement. Patient has appt 10/1. Patient verbalized understanding & had no questions.

## 2018-01-03 ENCOUNTER — Other Ambulatory Visit: Payer: 59

## 2018-01-03 ENCOUNTER — Ambulatory Visit (INDEPENDENT_AMBULATORY_CARE_PROVIDER_SITE_OTHER): Payer: 59 | Admitting: Advanced Practice Midwife

## 2018-01-03 ENCOUNTER — Encounter: Payer: Self-pay | Admitting: Advanced Practice Midwife

## 2018-01-03 VITALS — BP 117/73 | HR 102 | Wt 171.9 lb

## 2018-01-03 DIAGNOSIS — O0990 Supervision of high risk pregnancy, unspecified, unspecified trimester: Secondary | ICD-10-CM

## 2018-01-03 DIAGNOSIS — O0992 Supervision of high risk pregnancy, unspecified, second trimester: Secondary | ICD-10-CM

## 2018-01-03 DIAGNOSIS — Z23 Encounter for immunization: Secondary | ICD-10-CM | POA: Diagnosis not present

## 2018-01-03 MED ORDER — HYDROCORTISONE ACETATE 25 MG RE SUPP: 25.0000 mg | Freq: Two times a day (BID) | RECTAL | 2 refills | Status: DC | PRN

## 2018-01-03 NOTE — Patient Instructions (Addendum)
Childbirth Education Options: [facility] Classes:  Childbirth education classes can help you get ready for a positive parenting experience. You can also meet other expectant parents and get free stuff for your baby. Each class runs for five weeks on the same night and costs $45 for the mother-to-be and her support person. Medicaid covers the cost if you are eligible. Call [phone] to register. [facility] Childbirth Education:  [phone] or [phone] or [email or web address]  Baby & Me Class: Discuss newborn & infant parenting and family adjustment issues with other new mothers in a relaxed environment. Each week brings a new speaker or baby-centered activity. We encourage new mothers to join us every Thursday at 11:00am. Babies birth until crawling. No registration or fee. Daddy Boot Camp: This course offers Dads-to-be the tools and knowledge needed to feel confident on their journey to becoming new fathers. Experienced dads, who have been trained as coaches, teach dads-to-be how to hold, comfort, diaper, swaddle and play with their infant while being able to support the new mom as well. A class for men taught by men. $25/dad Big Brother/Big Sister: Let your children share in the joy of a new brother or sister in this special class designed just for them. Class includes discussion about how families care for babies: swaddling, holding, diapering, safety as well as how they can be helpful in their new role. This class is designed for children ages 2 to 6, but any age is welcome. Please register each child individually. $5/child  Mom Talk: This mom-led group offers support and connection to mothers as they journey through the adjustments and struggles of that sometimes overwhelming first year after the birth of a child. Tuesdays at 10:00am and Thursdays at 6:00pm. Babies welcome. No registration or fee. Breastfeeding Support Group: This group is a mother-to-mother  support circle where moms have the opportunity to share their breastfeeding experiences. A Lactation Consultant is present for questions and concerns. Meets each Tuesday at 11:00am. No fee or registration. Breastfeeding Your Baby: Learn what to expect in the first days of breastfeeding your newborn.  This class will help you feel more confident with the skills needed to begin your breastfeeding experience. Many new mothers are concerned about breastfeeding after leaving the hospital. This class will also address the most common fears and challenges about breastfeeding during the first few weeks, months and beyond. (call for fee) Comfort Techniques and Tour: This 2 hour interactive class will provide you the opportunity to learn & practice hands-on techniques that can help relieve some of the discomfort of labor and encourage your baby to rotate toward the best position for birth. You and your partner will be able to try a variety of labor positions with birth balls and rebozos as well as practice breathing, relaxation, and visualization techniques. A tour of the [facility] is included with this class. $20 per registrant and support person Childbirth Class- Weekend Option: This class is a Weekend version of our Birth & Baby series. It is designed for parents who have a difficult time fitting several weeks of classes into their schedule. It covers the care of your newborn and the basics of labor and childbirth. It also includes a Maternity Care Center Tour of [facility] and lunch. The class is held two consecutive days: beginning on Friday evening from 6:30 - 8:30 p.m. and the next day, Saturday from 9 a.m. - 4 p.m. (call for fee) Waterbirth Class: Interested in a waterbirth?  This   informational class will help you discover whether waterbirth is the right fit for you. Education about waterbirth itself, supplies you would need and how to assemble your support team is what you can  expect from this class. Some obstetrical practices require this class in order to pursue a waterbirth. (Not all obstetrical practices offer waterbirth-check with your healthcare provider.) Register only the expectant mom, but you are encouraged to bring your partner to class! Required if planning waterbirth, no fee. Infant/Child CPR: Parents, grandparents, babysitters, and friends learn Cardio-Pulmonary Resuscitation skills for infants and children. You will also learn how to treat both conscious and unconscious choking in infants and children. This Family & Friends program does not offer certification. Register each participant individually to ensure that enough mannequins are available. (Call for fee) Grandparent Love: Expecting a grandbaby? This class is for you! Learn about the latest infant care and safety recommendations and ways to support your own child as he or she transitions into the parenting role. Taught by Registered Nurses who are childbirth instructors, but most importantly...they are grandmothers too! $10/person. Childbirth Class- Natural Childbirth: This series of 5 weekly classes is for expectant parents who want to learn and practice natural methods of coping with the process of labor and childbirth. Relaxation, breathing, massage, visualization, role of the partner, and helpful positioning are highlighted. Participants learn how to be confident in their body's ability to give birth. This class will empower and help parents make informed decisions about their own care. Includes discussion that will help new parents transition into the immediate postpartum period. Maternity Care Center Tour of [facility] is included. We suggest taking this class between 25-32 weeks, but it's only a recommendation. $75 per registrant and one support person or $30 Medicaid. Childbirth Class- 3 week Series: This option of 3 weekly classes helps you and your labor partner prepare for childbirth. Newborn  care, labor & birth, cesarean birth, pain management, and comfort techniques are discussed and a Maternity Care Center Tour of [facility] is included. The class meets at the same time, on the same day of the week for 3 consecutive weeks beginning with the starting date you choose. $60 for registrant and one support person.  Marvelous Multiples: Expecting twins, triplets, or more? This class covers the differences in labor, birth, parenting, and breastfeeding issues that face multiples' parents. NICU tour is included. Led by a Certified Childbirth Educator who is the mother of twins. No fee. Caring for Baby: This class is for expectant and adoptive parents who want to learn and practice the most up-to-date newborn care for their babies. Focus is on birth through the first six weeks of life. Topics include feeding, bathing, diapering, crying, umbilical cord care, circumcision care and safe sleep. Parents learn to recognize symptoms of illness and when to call the pediatrician. Register only the mom-to-be and your partner or support person can plan to come with you! $10 per registrant and support person Childbirth Class- online option: This online class offers you the freedom to complete a Birth and Baby series in the comfort of your own home. The flexibility of this option allows you to review sections at your own pace, at times convenient to you and your support people. It includes additional video information, animations, quizzes, and extended activities. Get organized with helpful eClass tools, checklists, and trackers. Once you register online for the class, you will receive an email within a few days to accept the invitation and begin the class when the time   is right for you. The content will be available to you for 60 days. $60 for 60 days of online access for you and your support people.  Local Doulas: Natural Baby Doulas [email or web address] Tel:  [phone] [email or web address] [facility] [phone] [email or web address] The Labor Ladies  (also do waterbirth tub rental) [phone] [email or web address] [facility] [phone] [email or web address] Sacred Rhythms  [phone] [email or web address] [place] [employer] (PADA) [email or web address] http://www.padanc.org/index.htm [place] Birth and Baby  [email or web address] Considering Waterbirth? Guide for patients at [facility]  Why consider waterbirth?  . Gentle birth for babies . Less pain medicine used in labor . May allow for passive descent/less pushing . May reduce perineal tears  . More mobility and instinctive maternal position changes . Increased maternal relaxation . Reduced blood pressure in labor  Is waterbirth safe? What are the risks of infection, drowning or other complications?  . Infection: o Very low risk (3.7 % for tub vs 4.8% for bed) o 7 in 8000 waterbirths with documented infection o Poorly cleaned equipment most common cause o Slightly lower group B strep transmission rate  . Drowning o Maternal:  - Very low risk   - Related to seizures or fainting o Newborn:  - Very low risk. No evidence of increased risk of respiratory problems in multiple large studies - Physiological protection from breathing under water - Avoid underwater birth if there are any fetal complications - Once baby's head is out of the water, keep it out.  . Birth complication o Some reports of cord trauma, but risk decreased by bringing baby to surface gradually o No evidence of increased risk of shoulder dystocia. Mothers can usually change positions faster in water than in a bed, possibly aiding the maneuvers to free the shoulder.   You must attend a Waterbirth class at [facility]  3rd Wednesday of every month from 7-9pm  Free  Register by calling [phone] or online at [email or web address]  Bring us the certificate from the class to your prenatal appointment  Meet with a midwife at 36 weeks to see if you can still plan a waterbirth and to sign the consent.   Purchase or rent the following supplies:   Water Birth Pool (Birth Pool in a Box or [place] for instance)  (Tubs start ~$125)  Single-use disposable tub liner designed for your brand of tub  New garden hose labeled "lead-free", "suitable for drinking water",  Electric drain pump to remove water (We recommend 792 gallon per hour or greater pump.)   Separate garden hose to remove the dirty water  Fish net  Bathing suit top (optional)  Long-handled mirror (optional)  Places to purchase or rent supplies  [email or web address] for tub purchases and supplies  Waterbirthsolutions.com for tub purchases and supplies  The Labor Ladies ([email or web address]) $275 for tub rental/set-up & take down/kit   [place] [facility] [employer] (http://www.padanc.org/MeetUs.htm) Information regarding doulas (labor support) who provide pool rentals  Our practice has a Birth Pool in a Box tub at the hospital that you may borrow on a first-come-first-served basis. It is your responsibility to to set up, clean and break down the tub. We cannot guarantee the availability of this tub in advance. You are responsible for bringing all accessories listed above. If you do not have all necessary supplies you cannot have a waterbirth.    Things that would prevent you from having a waterbirth:  Premature, <37wks  Previous cesarean birth  Presence of thick meconium-stained fluid  Multiple gestation (Twins,   triplets, etc.)  Uncontrolled diabetes or gestational diabetes requiring medication  Hypertension requiring medication or diagnosis of pre-eclampsia  Heavy vaginal bleeding  Non-reassuring fetal  heart rate  Active infection (MRSA, etc.). Group B Strep is NOT a contraindication for  waterbirth.  If your labor has to be induced and induction method requires continuous  monitoring of the baby's heart rate  Other risks/issues identified by your obstetrical provider  Please remember that birth is unpredictable. Under certain unforeseeable circumstances your provider may advise against giving birth in the tub. These decisions will be made on a case-by-case basis and with the safety of you and your baby as our highest priority.  Places to have your son circumcised:    [facility] [phone] while you are in hospital  [facility] [phone] $244 by 4 wks  [facility] [phone] $175 by 2 wks  [facility] [phone] $250 by 7 days [facility] [phone] $269 by 4 wks  These prices sometimes change but are roughly what you can expect to pay. Please call and confirm pricing.   Circumcision is considered an elective/non-medically necessary procedure. There are many reasons parents decide to have their sons circumsized. During the first year of life circumcised males have a reduced risk of urinary tract infections but after this year the rates between circumcised males and uncircumcised males are the same.  It is safe to have your son circumcised outside of the hospital and the places above perform them regularly.   Deciding about Circumcision in Baby Boys  (Up-to-date The Basics)  What is circumcision?  Circumcision is a surgery that removes the skin that covers the tip of the penis, called the "foreskin" Circumcision is usually done when a boy is between 4 and 105 days old. In the [place], circumcision is common. In some other countries, fewer boys are circumcised. Circumcision is a common tradition in some  religions.  Should I have my baby boy circumcised?  There is no easy answer. Circumcision has some benefits. But it also has risks. After talking with your doctor, you will have to decide for yourself what is right for your family.  What are the benefits of circumcision?  Circumcised boys seem to have slightly lower rates of: ?Urinary tract infections ?Swelling of the opening at the tip of the penis Circumcised men seem to have slightly lower rates of: ?Urinary tract infections ?Swelling of the opening at the tip of the penis ?Penis cancer ?HIV and other infections that you catch during sex ?Cervical cancer in the women they have sex with Even so, in the [place], the risks of these problems are small - even in boys and men who have not been circumcised. Plus, boys and men who are not circumcised can reduce these extra risks by: ?Cleaning their penis well ?Using condoms during sex  What are the risks of circumcision?  Risks include: ?Bleeding or infection from the surgery ?Damage to or amputation of the penis ?A chance that the doctor will cut off too much or not enough of the foreskin ?A chance that sex won't feel as good later in life Only about 1 out of every 200 circumcisions leads to problems. There is also a chance that your health insurance won't pay for circumcision.  How is circumcision done in baby boys?  First, the baby gets medicine for pain relief. This might be a cream on the skin or a shot into the base of the penis. Next, the doctor cleans the baby's penis well. Then  he or she uses special tools to cut off the foreskin. Finally, the doctor wraps a bandage (called gauze) around the baby's penis. If you have your baby circumcised, his doctor or nurse will give you instructions on how to care for him after the surgery. It is important that you follow those instructions carefully.   AREA PEDIATRIC/FAMILY PRACTICE PHYSICIANS  [facility] [address] [place], [place]  [address] Phone - [phone]   Fax - [phone]  [facility] [address] [place], [place] [address] Phone - [phone]   Fax - [phone]  [facility] [address] [place], [place]  [address] Phone - [phone]   Fax - [phone]  [facility] [address] [place], [place]  [address] Phone - [phone]   Fax - [phone]  [facility] [address] [place], [place]  [address] Phone - [phone]   Fax - [phone]  [facility] [address] [place], [place]  [address] Phone - [phone]   Fax - [phone]  [facility] [address] [place], [place]  [address] Phone - [phone]   Fax - [phone]  [facility] [address] [place], [place]  [address] Phone - [phone]   Fax - [phone]  [facility] [address] [place], [place]  [address] Phone - [phone]   Fax - [phone] [facility] [address] [place], [place]  [address] Phone - [phone]   Fax - [phone]  [facility] [address] [place], [place]  [address] Phone - [phone]   Fax - [phone]  [facility] [address] [place], [place]  [address] Phone - [phone]   Fax - [phone]  [facility] [address] [place], [place]  [address] Phone - [phone]   Fax - [phone]  [facility]IL[facility] GOSR[facility] [address] [place], [place]  [address] Phone - [phone]  [facility] [address] [place], [place]  [address] Phone - [phone]   Fax - [phone]  [facility] [address] [place], [place]  [address] Phone - [phone]   Fax -  [phone]  [facility] [address] [place], [place]  [address] Phone - [phone]   Fax - [phone]  [facility] [address] [place], [place]  [address] Phone - [phone]   Fax - [phone]   [facility] [address] [place], [place]  [address] Phone - [phone]   Fax - [phone]  [facility] [address] [place], [place]  [address] Phone - [phone]   Fax - [phone]  [facility] [address] [place], [place]  [address] Phone - [phone]   Fax - [phone]  [facility] [address] , [place]  [address] Phone - [phone]   Fax - [phone]  [facility] [address] , [place]  [address] Phone - [phone]   Fax - [phone] [facility] [address] [place], [place]  [address] Phone - [phone]   Fax - [phone]  [facility] [address] [place],   [address] Phone - [phone]   Fax - [phone]  [facility] [address] [place], [place]  [address] Phone - [phone]   Fax - [phone]  [facility] Charlene Flemming MD [address] [place]  [address] Phone [phone]  Fax [phone]   

## 2018-01-03 NOTE — Progress Notes (Signed)
   PRENATAL VISIT NOTE  Subjective:  Kimberly Colon is a 27 y.o. G2P0010 at [redacted]w[redacted]d being seen today for ongoing prenatal care.  She is currently monitored for the following issues for this high-risk pregnancy and has Polycystic kidney disease; Supervision of high risk pregnancy, antepartum; and Hereditary disease in family possibly affecting fetus, fetus 1 on their problem list.  Patient reports no complaints.  Contractions: Not present. Vag. Bleeding: None, Scant.  Movement: Present. Denies leaking of fluid.   Patient was seen in the ED on 9/25 at [facility]. She was given yeast infection medication. She states that she mostly has bleeding after BMs. She has not tried any hemorrhoid medications.   The following portions of the patient's history were reviewed and updated as appropriate: allergies, current medications, past family history, past medical history, past social history, past surgical history and problem list. Problem list updated.  Objective:   Vitals:   01/03/18 0827  BP: 117/73  Pulse: (!) 102  Weight: 171 lb 14.4 oz (78 kg)    Fetal Status: Fetal Heart Rate (bpm): 156   Movement: Present     General:  Alert, oriented and cooperative. Patient is in no acute distress.  Skin: Skin is warm and dry. No rash noted.   Cardiovascular: Normal heart rate noted  Respiratory: Normal respiratory effort, no problems with respiration noted  Abdomen: Soft, gravid, appropriate for gestational age.  Pain/Pressure: Present     Pelvic: Cervical exam deferred        Extremities: Normal range of motion.  Edema: Trace  Mental Status: Normal mood and affect. Normal behavior. Normal judgment and thought content.   Assessment and Plan:  Pregnancy: G2P0010 at [redacted]w[redacted]d  1. Supervision of high risk pregnancy, antepartum - Routine care - Glucose Tolerance, 2 Hours w/1 Hour - CBC - HIV antibody (with reflex) - RPR - Patient is not taking ASA at this time. Again stressed the importance of taking.    - RX: anusol suppositories  - Patient has growth [place] with MFM 10/2  Preterm labor symptoms and general obstetric precautions including but not limited to vaginal bleeding, contractions, leaking of fluid and fetal movement were reviewed in detail with the patient. Please refer to After Visit Summary for other counseling recommendations.  Return in about 2 weeks (around 01/17/2018).  Future Appointments  Date Time Provider [place]  01/04/2018 11:15 AM [facility] [place] [employer], CNM

## 2018-01-04 ENCOUNTER — Encounter (HOSPITAL_COMMUNITY): Payer: Self-pay

## 2018-01-04 ENCOUNTER — Ambulatory Visit (HOSPITAL_COMMUNITY)
Admission: RE | Admit: 2018-01-04 | Discharge: 2018-01-04 | Disposition: A | Discharge status: Home / Self Care | Payer: 59 | Source: Ambulatory Visit | Attending: Obstetrics and Gynecology | Admitting: Obstetrics and Gynecology

## 2018-01-04 ENCOUNTER — Other Ambulatory Visit (HOSPITAL_COMMUNITY): Payer: Self-pay | Admitting: *Deleted

## 2018-01-04 DIAGNOSIS — Z3A27 27 weeks gestation of pregnancy: Secondary | ICD-10-CM | POA: Insufficient documentation

## 2018-01-04 DIAGNOSIS — D259 Leiomyoma of uterus, unspecified: Secondary | ICD-10-CM | POA: Diagnosis not present

## 2018-01-04 DIAGNOSIS — O10012 Pre-existing essential hypertension complicating pregnancy, second trimester: Secondary | ICD-10-CM | POA: Insufficient documentation

## 2018-01-04 DIAGNOSIS — Z3689 Encounter for other specified antenatal screening: Secondary | ICD-10-CM

## 2018-01-04 DIAGNOSIS — O10919 Unspecified pre-existing hypertension complicating pregnancy, unspecified trimester: Secondary | ICD-10-CM | POA: Diagnosis present

## 2018-01-04 DIAGNOSIS — O3412 Maternal care for benign tumor of corpus uteri, second trimester: Secondary | ICD-10-CM | POA: Diagnosis not present

## 2018-01-04 DIAGNOSIS — Z362 Encounter for other antenatal screening follow-up: Secondary | ICD-10-CM

## 2018-01-04 LAB — CBC
HEMOGLOBIN: 11.2 g/dL (ref 11.1–15.9)
Hematocrit: 33.9 % — ABNORMAL LOW (ref 34.0–46.6)
MCH: 30 pg (ref 26.6–33.0)
MCHC: 33 g/dL (ref 31.5–35.7)
MCV: 91 fL (ref 79–97)
PLATELETS: 255 10*3/uL (ref 150–450)
RBC: 3.73 x10E6/uL — AB (ref 3.77–5.28)
RDW: 14.6 % (ref 12.3–15.4)
WBC: 8.2 10*3/uL (ref 3.4–10.8)

## 2018-01-04 LAB — GLUCOSE TOLERANCE, 2 HOURS W/ 1HR
GLUCOSE, 1 HOUR: 114 mg/dL (ref 65–179)
GLUCOSE, 2 HOUR: 93 mg/dL (ref 65–152)
GLUCOSE, FASTING: 77 mg/dL (ref 65–91)

## 2018-01-04 LAB — HIV ANTIBODY (ROUTINE TESTING W REFLEX): HIV SCREEN 4TH GENERATION: NONREACTIVE

## 2018-01-04 LAB — RPR: RPR: NONREACTIVE

## 2018-01-05 ENCOUNTER — Telehealth: Payer: Self-pay | Admitting: *Deleted

## 2018-01-05 NOTE — Telephone Encounter (Signed)
Called to report sharp pains in her ribs with deep breaths and yawning.  Requests a call back to discuss.

## 2018-01-06 NOTE — Telephone Encounter (Signed)
Called patient stating I am returning her phone call. Patient states she noticed yesterday on her right side near her ribs, it hurts if she takes deep breaths or yawns. Patient does report being sick last week with cough/cold. Discussed with patient she likely pulled a muscle in that area & recommended several doses of tylenol. Discussed it will likely take several days to improve. Patient verbalized understanding & had no questions.

## 2018-01-09 ENCOUNTER — Encounter (HOSPITAL_COMMUNITY): Payer: Self-pay | Admitting: *Deleted

## 2018-01-09 ENCOUNTER — Inpatient Hospital Stay (HOSPITAL_COMMUNITY)
Admission: AD | Admit: 2018-01-09 | Discharge: 2018-01-09 | Disposition: A | Discharge status: Home / Self Care | Payer: 59 | Source: Ambulatory Visit | Attending: Obstetrics & Gynecology | Admitting: Obstetrics & Gynecology

## 2018-01-09 ENCOUNTER — Inpatient Hospital Stay (HOSPITAL_COMMUNITY): Payer: 59

## 2018-01-09 DIAGNOSIS — Z3A28 28 weeks gestation of pregnancy: Secondary | ICD-10-CM | POA: Diagnosis not present

## 2018-01-09 DIAGNOSIS — Z3689 Encounter for other specified antenatal screening: Secondary | ICD-10-CM

## 2018-01-09 DIAGNOSIS — R0781 Pleurodynia: Secondary | ICD-10-CM | POA: Diagnosis present

## 2018-01-09 DIAGNOSIS — R1011 Right upper quadrant pain: Secondary | ICD-10-CM | POA: Diagnosis not present

## 2018-01-09 DIAGNOSIS — O26893 Other specified pregnancy related conditions, third trimester: Secondary | ICD-10-CM

## 2018-01-09 LAB — URINALYSIS, ROUTINE W REFLEX MICROSCOPIC
Bilirubin Urine: NEGATIVE
Glucose, UA: NEGATIVE mg/dL
KETONES UR: NEGATIVE mg/dL
Nitrite: NEGATIVE
PH: 6 (ref 5.0–8.0)
Protein, ur: NEGATIVE mg/dL
Specific Gravity, Urine: 1.01 (ref 1.005–1.030)

## 2018-01-09 LAB — COMPREHENSIVE METABOLIC PANEL
ALK PHOS: 56 U/L (ref 38–126)
ALT: 21 U/L (ref 0–44)
ANION GAP: 8 (ref 5–15)
AST: 17 U/L (ref 15–41)
Albumin: 3 g/dL — ABNORMAL LOW (ref 3.5–5.0)
BUN: 10 mg/dL (ref 6–20)
CALCIUM: 9 mg/dL (ref 8.9–10.3)
CO2: 23 mmol/L (ref 22–32)
Chloride: 104 mmol/L (ref 98–111)
Creatinine, Ser: 0.52 mg/dL (ref 0.44–1.00)
GFR calc Af Amer: >60 mL/min (ref >60)
GFR calc non Af Amer: >60 mL/min (ref >60)
Glucose, Bld: 84 mg/dL (ref 70–99)
POTASSIUM: 3.6 mmol/L (ref 3.5–5.1)
SODIUM: 135 mmol/L (ref 135–145)
Total Bilirubin: 0.3 mg/dL (ref 0.3–1.2)
Total Protein: 6.5 g/dL (ref 6.5–8.1)

## 2018-01-09 LAB — CBC
HCT: 31.8 % — ABNORMAL LOW (ref 36.0–46.0)
HEMOGLOBIN: 10.8 g/dL — AB (ref 12.0–15.0)
MCH: 30.7 pg (ref 26.0–34.0)
MCHC: 34 g/dL (ref 30.0–36.0)
MCV: 90.3 fL (ref 78.0–100.0)
Platelets: 224 10*3/uL (ref 150–400)
RBC: 3.52 MIL/uL — ABNORMAL LOW (ref 3.87–5.11)
RDW: 13.7 % (ref 11.5–15.5)
WBC: 10.6 10*3/uL — ABNORMAL HIGH (ref 4.0–10.5)

## 2018-01-09 LAB — LIPASE, BLOOD: Lipase: 35 U/L (ref 11–51)

## 2018-01-09 MED ORDER — CYCLOBENZAPRINE HCL 10 MG PO TABS
10.0000 mg | ORAL_TABLET | Freq: Three times a day (TID) | ORAL | Status: DC | PRN
  Administered 2018-01-09: 10 mg via ORAL
  Filled 2018-01-09: qty 1

## 2018-01-09 MED ORDER — IOPAMIDOL (ISOVUE-370) INJECTION 76%
100.0000 mL | Freq: Once | INTRAVENOUS | Status: AC | PRN
  Administered 2018-01-09: 100 mL via INTRAVENOUS

## 2018-01-09 MED ORDER — ACETAMINOPHEN 500 MG PO TABS
1000.0000 mg | ORAL_TABLET | Freq: Four times a day (QID) | ORAL | Status: DC | PRN
  Administered 2018-01-09: 1000 mg via ORAL
  Filled 2018-01-09: qty 2

## 2018-01-09 MED ORDER — GI COCKTAIL ~~LOC~~
30.0000 mL | Freq: Once | ORAL | Status: AC
  Administered 2018-01-09: 30 mL via ORAL
  Filled 2018-01-09: qty 30

## 2018-01-09 NOTE — MAU Provider Note (Addendum)
History     CSN: [id number]  Arrival date and time: 01/09/18 2353   First Provider Initiated Contact with Patient 01/09/18 1017      Chief Complaint  Patient presents with  . Chest Pain   G2P0010 @28 .3 wks here with rib pain. Pain is located under right breast. Started 1 week ago. Pain is worsened by deep breaths, movement, and lying flat. Rates pain 8/10. Pain radiates into right shoulder and is sharp. C/o reflux daily. Has not used anything for it. Denies strenuous activity, injury, pushing, pulling, or muscle strain.    OB History    Gravida  2   Para      Term      Preterm      AB  1   Living        SAB      TAB  1   Ectopic      Multiple      Live Births           Obstetric Comments  G1: 2016 EAB, pills        Past Medical History:  Diagnosis Date  . Polycystic kidney disease   . Renal stone     Past Surgical History:  Procedure Laterality Date  . NO PAST SURGERIES      History reviewed. No pertinent family history.  Social History   Tobacco Use  . Smoking status: Never Smoker  . Smokeless tobacco: Never Used  Substance Use Topics  . Alcohol use: Not Currently  . Drug use: Not Currently    Allergies:  Allergies  Allergen Reactions  . Ibuprofen     Kidneys not able to process     Medications Prior to Admission  Medication Sig Dispense Refill Last Dose  . Prenatal Vit-Fe Fumarate-FA (PREPLUS) 27-1 MG TABS Take 1 tablet by mouth daily.   01/09/2018 at Unknown time  . Probiotic Product (PROBIOTIC PO) Take 1 capsule by mouth daily.   01/09/2018 at Unknown time  . aspirin EC 81 MG tablet Take 1 tablet (81 mg total) by mouth daily. (Patient not taking: Reported on 01/03/2018) 60 tablet 2 Not Taking at Unknown time  . hydrocortisone (ANUSOL-HC) 25 MG suppository Place 1 suppository (25 mg total) rectally 2 (two) times daily as needed for hemorrhoids or anal itching. (Patient not taking: Reported on 01/04/2018) 12 suppository 2 Not Taking at  Unknown time    Review of Systems  HENT: Negative for congestion and sore throat.   Respiratory: Negative for cough, chest tightness, shortness of breath and wheezing.   Cardiovascular: Negative for chest pain.  Gastrointestinal: Negative for abdominal pain.  Genitourinary: Negative for vaginal bleeding.   Physical Exam   Blood pressure 127/69, pulse (!) 106, temperature 98.8 F (37.1 C), resp. rate 16, weight 80.1 kg, last menstrual period 06/24/2017, SpO2 99 %.  Physical Exam  Constitutional: She is oriented to person, place, and time. She appears well-developed and well-nourished. No distress.  HENT:  Head: Normocephalic and atraumatic.  Neck: Normal range of motion.  Cardiovascular: Normal rate, regular rhythm and normal heart sounds.  Respiratory: Effort normal and breath sounds normal. No respiratory distress. She has no wheezes. She has no rales. She exhibits tenderness (at right costal border).  GI: Soft. She exhibits no distension. There is no tenderness.  Musculoskeletal: Normal range of motion. She exhibits no deformity.       Right shoulder: Normal. She exhibits normal range of motion, no tenderness, no bony tenderness, no  swelling, no crepitus, no deformity and no pain.  Neurological: She is alert and oriented to person, place, and time.  Skin: Skin is warm and dry.  Psychiatric: She has a normal mood and affect.  EFM: 145 bpm, mod variability, + accels, no decels Toco: none  Results for orders placed or performed during the hospital encounter of 01/09/18 (from the past 24 hour(s))  Urinalysis, Routine w reflex microscopic     Status: Abnormal   Collection Time: 01/09/18 10:03 AM  Result Value Ref Range   Color, Urine YELLOW YELLOW   APPearance CLOUDY (A) CLEAR   Specific Gravity, Urine 1.010 1.005 - 1.030   pH 6.0 5.0 - 8.0   Glucose, UA NEGATIVE NEGATIVE mg/dL   Hgb urine dipstick SMALL (A) NEGATIVE   Bilirubin Urine NEGATIVE NEGATIVE   Ketones, ur NEGATIVE  NEGATIVE mg/dL   Protein, ur NEGATIVE NEGATIVE mg/dL   Nitrite NEGATIVE NEGATIVE   Leukocytes, UA LARGE (A) NEGATIVE   RBC / HPF 11-20 0 - 5 RBC/hpf   WBC, UA >50 (H) 0 - 5 WBC/hpf   Bacteria, UA FEW (A) NONE SEEN   Squamous Epithelial / LPF 6-10 0 - 5   Mucus PRESENT   CBC     Status: Abnormal   Collection Time: 01/09/18  1:50 PM  Result Value Ref Range   WBC 10.6 (H) 4.0 - 10.5 K/uL   RBC 3.52 (L) 3.87 - 5.11 MIL/uL   Hemoglobin 10.8 (L) 12.0 - 15.0 g/dL   HCT 31.8 (L) 36.0 - 46.0 %   MCV 90.3 78.0 - 100.0 fL   MCH 30.7 26.0 - 34.0 pg   MCHC 34.0 30.0 - 36.0 g/dL   RDW 13.7 11.5 - 15.5 %   Platelets 224 150 - 400 K/uL  Comprehensive metabolic panel     Status: Abnormal   Collection Time: 01/09/18  1:50 PM  Result Value Ref Range   Sodium 135 135 - 145 mmol/L   Potassium 3.6 3.5 - 5.1 mmol/L   Chloride 104 98 - 111 mmol/L   CO2 23 22 - 32 mmol/L   Glucose, Bld 84 70 - 99 mg/dL   BUN 10 6 - 20 mg/dL   Creatinine, Ser 0.52 0.44 - 1.00 mg/dL   Calcium 9.0 8.9 - 10.3 mg/dL   Total Protein 6.5 6.5 - 8.1 g/dL   Albumin 3.0 (L) 3.5 - 5.0 g/dL   AST 17 15 - 41 U/L   ALT 21 0 - 44 U/L   Alkaline Phosphatase 56 38 - 126 U/L   Total Bilirubin 0.3 0.3 - 1.2 mg/dL   GFR calc non Af Amer >60 >60 mL/min   GFR calc Af Amer >60 >60 mL/min   Anion gap 8 5 - 15  Lipase, blood     Status: None   Collection Time: 01/09/18  1:50 PM  Result Value Ref Range   Lipase 35 11 - 51 U/L   US Abdomen Limited Ruq  Result Date: 01/09/2018 CLINICAL DATA:  Right upper quadrant pain EXAM: ULTRASOUND ABDOMEN LIMITED RIGHT UPPER QUADRANT COMPARISON:  None. FINDINGS: Gallbladder: No gallstones or wall thickening visualized. There is no pericholecystic fluid. No sonographic Murphy sign noted by sonographer. Common bile duct: Diameter: 3 mm. No intrahepatic or extrahepatic biliary duct dilatation. Liver: No focal lesion identified. Within normal limits in parenchymal echogenicity. Portal vein is patent on  color Doppler imaging with normal direction of blood flow towards the liver. Multiple small cysts are noted throughout the  right kidney. IMPRESSION: Multiple right renal cysts.  Study otherwise unremarkable. Electronically Signed   By: Lowella Grip III M.D.   On: 01/09/2018 16:28    MAU Course  Procedures Flexeril GI cocktail Tylenol  MDM Labs ordered and reviewed. Pain not improved at all after meds. RUQ [place] ordered. [place] normal. Continues to have pain, rates 8/10. Consult with Dr. Rosana Hoes, plan for CT to r/o PE. CT pending. Transfer of care given to Maryfrances Bunnell, CNM  01/09/2018 8:12 PM   CT results reviewed  Ct Angio Chest Pe W Or Wo Contrast  Result Date: 01/09/2018 CLINICAL DATA:  Patient [redacted] weeks pregnant. Pain below the right breast. No shortness of breath. EXAM: CT ANGIOGRAPHY CHEST WITH CONTRAST TECHNIQUE: Multidetector CT imaging of the chest was performed using the standard protocol during bolus administration of intravenous contrast. Multiplanar CT image reconstructions and MIPs were obtained to evaluate the vascular anatomy. CONTRAST:  136mL ISOVUE-370 IOPAMIDOL (ISOVUE-370) INJECTION 76% COMPARISON:  None. FINDINGS: Cardiovascular: Contrast opacification of the pulmonary arteries is slightly less than that of the aorta normal mildly limiting the exam for the assessment smaller segmental and subsegmental pulmonary arteries. Allowing for this, there is no evidence of a pulmonary embolism. Heart is normal in size and configuration. No pericardial effusion. Great vessels are within normal limits. Mediastinum/Nodes: No enlarged mediastinal, hilar, or axillary lymph nodes. Thyroid gland, trachea, and esophagus demonstrate no significant findings. Lungs/Pleura: Minimal right pleural effusion. Bilateral lower lobe dependent opacity, right greater than left, consistent with atelectasis. Remainder of the lungs is clear. No left pleural effusion. No pneumothorax. Upper Abdomen: No  acute findings. Visualized portions of the kidneys demonstrate multiple cysts. Small cyst in the liver. Musculoskeletal: Dextroscoliosis of the thoracic spine. No fracture or acute finding. No bone lesion. Review of the MIP images confirms the above findings. IMPRESSION: 1. No evidence of a pulmonary embolism. 2. Minimal right pleural effusion. Right greater than left dependent lower lobe atelectasis. No evidence of pneumonia or pulmonary edema. Electronically Signed   By: Lajean Manes M.D.   On: 01/09/2018 20:12   NST reactive  CT negative for PE- most likely musculoskeletal pain. Discussed results of CT with patient. Educated on physiological changes during pregnancy and shifting of diaphragm and ribs. Answered patient questions. Discussed position changes, use of pillows and support belt.  Patient able to eat while in MAU with no complaints or complications. Patient reports pain is 6/10. Discussed reasons to return to MAU. Pt stable at time of discharge.  Assessment and Plan   1. Rib pain on right side   2. RUQ pain   3. [redacted] weeks gestation of pregnancy   4. NST (non-stress test) reactive    Discharge home  Follow up as scheduled for prenatal appointment  Return to MAU as needed  Tylenol for pain during pregnancy, safe medications during pregnancy discussed  Position changes, use of pillows and support belt Continue medication as prescribed   Follow-up [place] for [place] Follow up.   Specialty:  Obstetrics and Gynecology Why:  Follow up as scheduled for prenatal appointments Contact information: [place] [phone]         Allergies as of 01/09/2018      Reactions   Ibuprofen    Kidneys not able to process       Medication List    TAKE these medications   aspirin EC 81 MG tablet Take 1 tablet (81  mg total) by mouth daily.   hydrocortisone 25 MG suppository Commonly known as:  ANUSOL-HC Place 1  suppository (25 mg total) rectally 2 (two) times daily as needed for hemorrhoids or anal itching.   PREPLUS 27-1 MG Tabs Take 1 tablet by mouth daily.   PROBIOTIC PO Take 1 capsule by mouth daily.      Lajean Manes, CNM 01/09/18, 11:19 PM

## 2018-01-09 NOTE — MAU Provider Note (Signed)
History     CSN: [id number]  Arrival date and time: 01/09/18 4696   First Provider Initiated Contact with Patient 01/09/18 1017      Chief Complaint  Patient presents with  . Chest Pain   Kimberly Colon is a 27 y.o. G2P0010 with hx of PKD presenting today with worsening rib pain underneath her right breast that started 1 week ago. This pain is worse with motion and deep breaths. She called in last week when the pain started and was told she likely pulled a muscle and was recommended to take tylenol. Pain was initially intermittent (6/10) but worsened yesterday (01/08/18) and noticed the pain radiating to her shoulder. The pain is now constant ~8/10, worse when lying flat. Driving and turning the steering wheel was uncomfortable on her way here. She denies any new back pain. She also endorses reflux which has been bad over the last couple days. She denies any strenuous activities, states this has never happened, and tylenol has not really helped. Denies cough, SOB, and is able to ambulate without symptoms.   Chest Pain   Pertinent negatives include no back pain, cough, fever or shortness of breath.    Past Medical History:  Diagnosis Date  . Polycystic kidney disease   . Renal stone     Past Surgical History:  Procedure Laterality Date  . NO PAST SURGERIES      History reviewed. No pertinent family history.  Social History   Tobacco Use  . Smoking status: Never Smoker  . Smokeless tobacco: Never Used  Substance Use Topics  . Alcohol use: Not Currently  . Drug use: Not Currently    Allergies:  Allergies  Allergen Reactions  . Ibuprofen     Kidneys not able to process     Medications Prior to Admission  Medication Sig Dispense Refill Last Dose  . aspirin EC 81 MG tablet Take 1 tablet (81 mg total) by mouth daily. (Patient not taking: Reported on 01/03/2018) 60 tablet 2 Not Taking  . hydrocortisone (ANUSOL-HC) 25 MG suppository Place 1 suppository (25 mg total)  rectally 2 (two) times daily as needed for hemorrhoids or anal itching. (Patient not taking: Reported on 01/04/2018) 12 suppository 2 Not Taking  . Prenatal Vit-Fe Fumarate-FA (PREPLUS) 27-1 MG TABS Take 1 tablet by mouth daily.   Taking    Review of Systems  Constitutional: Negative for activity change, appetite change, chills and fever.  HENT: Positive for congestion and rhinorrhea.   Respiratory: Negative for cough, chest tightness and shortness of breath.   Cardiovascular: Positive for chest pain.  Musculoskeletal: Negative for back pain.   Physical Exam   Blood pressure 127/69, pulse (!) 106, temperature 98.8 F (37.1 C), resp. rate 16, weight 80.1 kg, last menstrual period 06/24/2017, SpO2 99 %.  Physical Exam  Constitutional: She appears well-developed and well-nourished.  HENT:  Head: Normocephalic and atraumatic.  Neck: Normal range of motion.  Cardiovascular: Normal rate, regular rhythm and normal heart sounds.  Respiratory: Breath sounds normal.  Decreased effort on inspiration due to pain   Musculoskeletal: She exhibits tenderness.       Arms:   MAU Course  Procedures  MDM Labs ordered and reviewed #heat packs given for MSK sx, no relief. GI coctail, flexeril given, pain also unimproved. # RUQ U/S shows renal cysts consistent with PKD, no issues with biliary tree and nl otherwise #CT results pending  Assessment and Plan  Kimberly Colon is a 27 y.o. G2P0010 with hx of  PKD here with subcostal pain underneath the right breast likely worsened by GERD. CT results pending to rule out P.E. Or other pulmonary pathologies given unimproved course.   #recommend pepcid daily.  Care turned over to [place] 01/09/2018, 10:39 AM

## 2018-01-09 NOTE — MAU Note (Signed)
Pain started last wk, was in ribs below rt breast. Was sick, told might have pulled something.  Still hurting at ribs, now up to shoulder.

## 2018-01-17 ENCOUNTER — Ambulatory Visit (INDEPENDENT_AMBULATORY_CARE_PROVIDER_SITE_OTHER): Payer: 59 | Admitting: Obstetrics and Gynecology

## 2018-01-17 ENCOUNTER — Other Ambulatory Visit (HOSPITAL_COMMUNITY)
Admission: RE | Admit: 2018-01-17 | Discharge: 2018-01-17 | Disposition: A | Discharge status: Home / Self Care | Payer: 59 | Source: Ambulatory Visit | Attending: Obstetrics and Gynecology | Admitting: Obstetrics and Gynecology

## 2018-01-17 VITALS — BP 122/79 | HR 96 | Wt 180.0 lb

## 2018-01-17 DIAGNOSIS — O98313 Other infections with a predominantly sexual mode of transmission complicating pregnancy, third trimester: Secondary | ICD-10-CM | POA: Insufficient documentation

## 2018-01-17 DIAGNOSIS — Q613 Polycystic kidney, unspecified: Secondary | ICD-10-CM

## 2018-01-17 DIAGNOSIS — A630 Anogenital (venereal) warts: Secondary | ICD-10-CM | POA: Insufficient documentation

## 2018-01-17 DIAGNOSIS — Z3A29 29 weeks gestation of pregnancy: Secondary | ICD-10-CM | POA: Insufficient documentation

## 2018-01-17 DIAGNOSIS — O0990 Supervision of high risk pregnancy, unspecified, unspecified trimester: Secondary | ICD-10-CM

## 2018-01-17 NOTE — Progress Notes (Signed)
   PRENATAL VISIT NOTE  Subjective:  Kimberly Colon is a 27 y.o. G2P0010 at [redacted]w[redacted]d being seen today for ongoing prenatal care.  She is currently monitored for the following issues for this high-risk pregnancy and has Polycystic kidney disease; Supervision of high risk pregnancy, antepartum; Hereditary disease in family possibly affecting fetus, fetus 1; and Genital warts complicating pregnancy, third trimester on their problem list.  Patient reports vaginal irritation, and vaginal discharge. No vaginitis. Has tried creams which are not helping.  Contractions: Not present. Vag. Bleeding: None.  Movement: Present. Denies leaking of fluid.   The following portions of the patient's history were reviewed and updated as appropriate: allergies, current medications, past family history, past medical history, past social history, past surgical history and problem list. Problem list updated.  Objective:   Vitals:   01/17/18 0947  BP: 122/79  Pulse: 96  Weight: 180 lb (81.6 kg)    Fetal Status: Fetal Heart Rate (bpm): 160 Fundal Height: 30 cm Movement: Present     General:  Alert, oriented and cooperative. Patient is in no acute distress.  Skin: Skin is warm and dry. No rash noted.   Cardiovascular: Normal heart rate noted  Respiratory: Normal respiratory effort, no problems with respiration noted  Abdomen: Soft, gravid, appropriate for gestational age.  Pain/Pressure: Present     Pelvic: Cervical exam deferred        Extremities: Normal range of motion.  Edema: Trace  Mental Status: Normal mood and affect. Normal behavior. Normal judgment and thought content.   Assessment and Plan:  Pregnancy: G2P0010 at [redacted]w[redacted]d   1. Genital warts complicating pregnancy, third trimester - Cervicovaginal ancillary only  2. Supervision of high risk pregnancy, antepartum  -Doing well  - Continue ASA daily   3. Polycystic kidney disease  - Growth Korea scheduled for 02/01/18 per MFM  - Appropriate growth on  [place] 10/2  There are no diagnoses linked to this encounter. Preterm labor symptoms and general obstetric precautions including but not limited to vaginal bleeding, contractions, leaking of fluid and fetal movement were reviewed in detail with the patient. Please refer to After Visit Summary for other counseling recommendations.  Return in about 2 weeks (around 01/31/2018) for High Risk pregnancy .  Future Appointments  Date Time Provider [place]  02/01/2018  9:15 AM [place] [facility]  02/01/2018 12:45 PM [place] 2 [facility]    Noni Saupe, NP

## 2018-01-18 ENCOUNTER — Other Ambulatory Visit: Payer: Self-pay | Admitting: Obstetrics and Gynecology

## 2018-01-18 LAB — CERVICOVAGINAL ANCILLARY ONLY
Bacterial vaginitis: NEGATIVE
CHLAMYDIA, DNA PROBE: NEGATIVE
Candida vaginitis: POSITIVE — AB
NEISSERIA GONORRHEA: NEGATIVE
TRICH (WINDOWPATH): NEGATIVE

## 2018-01-18 MED ORDER — TERCONAZOLE 0.4 % VA CREA: 1.0000 | TOPICAL_CREAM | Freq: Every day | VAGINAL | 0 refills | Status: DC

## 2018-01-18 NOTE — Progress Notes (Signed)
+   yeast  Rx sent for Terazol 7 My chart message sent to patient  Leonardo Plaia, Artist Pais, NP 01/18/2018 4:53 PM

## 2018-01-20 ENCOUNTER — Inpatient Hospital Stay (HOSPITAL_COMMUNITY)
Admission: AD | Admit: 2018-01-20 | Discharge: 2018-01-20 | Disposition: A | Discharge status: Home / Self Care | Payer: 59 | Source: Ambulatory Visit | Attending: Obstetrics and Gynecology | Admitting: Obstetrics and Gynecology

## 2018-01-20 ENCOUNTER — Encounter (HOSPITAL_COMMUNITY): Payer: Self-pay | Admitting: *Deleted

## 2018-01-20 DIAGNOSIS — O98813 Other maternal infectious and parasitic diseases complicating pregnancy, third trimester: Secondary | ICD-10-CM | POA: Insufficient documentation

## 2018-01-20 DIAGNOSIS — B079 Viral wart, unspecified: Secondary | ICD-10-CM

## 2018-01-20 DIAGNOSIS — A630 Anogenital (venereal) warts: Secondary | ICD-10-CM | POA: Diagnosis not present

## 2018-01-20 DIAGNOSIS — Z3A30 30 weeks gestation of pregnancy: Secondary | ICD-10-CM | POA: Insufficient documentation

## 2018-01-20 DIAGNOSIS — N888 Other specified noninflammatory disorders of cervix uteri: Secondary | ICD-10-CM

## 2018-01-20 DIAGNOSIS — O0990 Supervision of high risk pregnancy, unspecified, unspecified trimester: Secondary | ICD-10-CM

## 2018-01-20 DIAGNOSIS — O98313 Other infections with a predominantly sexual mode of transmission complicating pregnancy, third trimester: Secondary | ICD-10-CM | POA: Insufficient documentation

## 2018-01-20 DIAGNOSIS — B373 Candidiasis of vulva and vagina: Secondary | ICD-10-CM | POA: Diagnosis not present

## 2018-01-20 DIAGNOSIS — O4693 Antepartum hemorrhage, unspecified, third trimester: Secondary | ICD-10-CM | POA: Insufficient documentation

## 2018-01-20 MED ORDER — FLUCONAZOLE 150 MG PO TABS
150.0000 mg | ORAL_TABLET | Freq: Once | ORAL | Status: AC
  Administered 2018-01-20: 150 mg via ORAL
  Filled 2018-01-20: qty 1

## 2018-01-20 MED ORDER — FERRIC SUBSULFATE 259 MG/GM EX SOLN: Freq: Once | CUTANEOUS | Status: DC

## 2018-01-20 MED ORDER — FERRIC SUBSULFATE 259 MG/GM EX SOLN
CUTANEOUS | Status: AC
  Administered 2018-01-20: 23:00:00
  Filled 2018-01-20: qty 8

## 2018-01-20 MED ORDER — FERRIC SUBSULFATE SOLN
Freq: Once | Status: DC
  Filled 2018-01-20: qty 100
  Filled 2018-01-20: qty 8

## 2018-01-20 NOTE — MAU Note (Signed)
Pt reports she has been using an applicator to insert a yeast medication and tonight the applicator had some blood on the end.

## 2018-01-20 NOTE — MAU Provider Note (Signed)
History    CSN: [id number] Arrival date and time: 01/20/18 2043  Chief Complaint  Patient presents with  . Vaginal Bleeding   HPI 27yo G2P0010 at [redacted]w[redacted]d who presents with the complaint of vaginal bleeding. She states she was trying to place a vaginal applicator with cream for a yeast infection, but she stopped because she noticed some bleeding on the edge of the applicator. Denies any pain, cramping, discomfort. States she had a little vaginal bleeding early in pregnancy, also had some rectal bleeding after hard stools. Denies recent intercourse. States has had a yeast infection her entire pregnancy, lots of itching and discharge.   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 Partner without lesions. States first started on inside of vagina.   OB History    Gravida  2   Para      Term      Preterm      AB  1   Living        SAB      TAB  1   Ectopic      Multiple      Live Births           Obstetric Comments  G1: 2016 EAB, pills        Past Medical History:  Diagnosis Date  . Polycystic kidney disease   . Renal stone     Past Surgical History:  Procedure Laterality Date  . NO PAST SURGERIES      History reviewed. No pertinent family history.  Social History   Tobacco Use  . Smoking status: Never Smoker  . Smokeless tobacco: Never Used  Substance Use Topics  . Alcohol use: Not Currently  . Drug use: Not Currently    Allergies:  Allergies  Allergen Reactions  . Ibuprofen     Kidneys not able to process     Medications Prior to Admission  Medication Sig Dispense Refill Last Dose  . Prenatal Vit-Fe Fumarate-FA (PREPLUS) 27-1 MG TABS Take 1 tablet by mouth daily.   01/20/2018 at Unknown time  . Probiotic Product (PROBIOTIC PO) Take 1 capsule by mouth daily.   01/20/2018 at Unknown time  . terconazole (TERAZOL 7) 0.4 % vaginal cream Place 1 applicator vaginally at bedtime. 45 g  0 01/20/2018 at Unknown time  . aspirin EC 81 MG tablet Take 1 tablet (81 mg total) by mouth daily. (Patient not taking: Reported on 01/03/2018) 60 tablet 2 Not Taking at Unknown time  . hydrocortisone (ANUSOL-HC) 25 MG suppository Place 1 suppository (25 mg total) rectally 2 (two) times daily as needed for hemorrhoids or anal itching. (Patient not taking: Reported on 01/04/2018) 12 suppository 2 Not Taking at Unknown time    Review of Systems  Constitutional: Negative for activity change.  Gastrointestinal: Negative for abdominal pain, nausea and vomiting.  Genitourinary: Positive for vaginal bleeding and vaginal discharge. Negative for dysuria, flank pain and vaginal pain.   Physical Exam   Blood pressure 131/77, pulse (!) 104, temperature 98.1 F (36.7 C), temperature source Oral, resp. rate 15, height 5\' 7"  (1.702 m), weight 83.9 kg, last menstrual period 06/24/2017, SpO2 100 %.  Physical Exam  Nursing note and vitals reviewed. Constitutional: She is oriented to person, place, and time. She appears well-developed and well-nourished. No distress.  HENT:  Head: Normocephalic and atraumatic.  Eyes: Conjunctivae and EOM are normal. No scleral icterus.  Respiratory: No respiratory distress.  GI: There is no tenderness.  gravid  Genitourinary: Vaginal discharge found.  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  Psychiatric: She has a normal mood and affect. Her behavior is normal.   MAU Course  Procedures  MDM -- Bleeding noted to several warts near cervix and friability near os. No bleeding noted coming from os. Monsel's solution applied to area with decrease in bleeding noted.  -- Consulted with Dr. Rip Harbour  who agrees with plan of care -- FHTs 130-140s   Assessment and Plan  27yo G2P0010 at [redacted]w[redacted]d who presents with the complaint of vaginal bleeding related to irritation of HPV genital warts and yeast infection. Recommended stopping applicator for yeast infection and given Diflucan treatment here. Pap smear normal this pregnancy. Monsel's applied with good resolution of bleeding. Recommended nothing in vagina until follow-up visit in 2 weeks. All questions answered and reviewed return precautions.   Glenice Bow, DO  01/20/2018, 9:13 PM

## 2018-01-20 NOTE — Discharge Instructions (Signed)
·   Return to clinic if you notice worsening bleeding  Tell your doctor if you are having worsening symptoms related to your yeast infection  Do not put anything in your vagina for the next two weeks to avoid irritation of the warts

## 2018-02-01 ENCOUNTER — Encounter (HOSPITAL_COMMUNITY): Payer: Self-pay

## 2018-02-01 ENCOUNTER — Other Ambulatory Visit (HOSPITAL_COMMUNITY)
Admission: RE | Admit: 2018-02-01 | Discharge: 2018-02-01 | Disposition: A | Discharge status: Home / Self Care | Payer: 59 | Source: Ambulatory Visit | Attending: Family Medicine | Admitting: Family Medicine

## 2018-02-01 ENCOUNTER — Ambulatory Visit (HOSPITAL_COMMUNITY)
Admission: RE | Admit: 2018-02-01 | Discharge: 2018-02-01 | Disposition: A | Discharge status: Home / Self Care | Payer: Medicaid Other | Source: Ambulatory Visit | Attending: Obstetrics and Gynecology | Admitting: Obstetrics and Gynecology

## 2018-02-01 ENCOUNTER — Ambulatory Visit (INDEPENDENT_AMBULATORY_CARE_PROVIDER_SITE_OTHER): Payer: 59 | Admitting: Family Medicine

## 2018-02-01 VITALS — BP 128/73 | HR 106 | Wt 187.0 lb

## 2018-02-01 DIAGNOSIS — O10013 Pre-existing essential hypertension complicating pregnancy, third trimester: Secondary | ICD-10-CM | POA: Diagnosis not present

## 2018-02-01 DIAGNOSIS — Z3A31 31 weeks gestation of pregnancy: Secondary | ICD-10-CM | POA: Insufficient documentation

## 2018-02-01 DIAGNOSIS — O0990 Supervision of high risk pregnancy, unspecified, unspecified trimester: Secondary | ICD-10-CM | POA: Diagnosis present

## 2018-02-01 DIAGNOSIS — O3412 Maternal care for benign tumor of corpus uteri, second trimester: Secondary | ICD-10-CM

## 2018-02-01 DIAGNOSIS — Z362 Encounter for other antenatal screening follow-up: Secondary | ICD-10-CM | POA: Diagnosis not present

## 2018-02-01 DIAGNOSIS — B373 Candidiasis of vulva and vagina: Secondary | ICD-10-CM

## 2018-02-01 DIAGNOSIS — O0993 Supervision of high risk pregnancy, unspecified, third trimester: Secondary | ICD-10-CM | POA: Insufficient documentation

## 2018-02-01 DIAGNOSIS — Z3689 Encounter for other specified antenatal screening: Secondary | ICD-10-CM | POA: Diagnosis present

## 2018-02-01 DIAGNOSIS — B3731 Acute candidiasis of vulva and vagina: Secondary | ICD-10-CM

## 2018-02-01 DIAGNOSIS — O2693 Pregnancy related conditions, unspecified, third trimester: Secondary | ICD-10-CM | POA: Diagnosis not present

## 2018-02-01 DIAGNOSIS — O10919 Unspecified pre-existing hypertension complicating pregnancy, unspecified trimester: Secondary | ICD-10-CM | POA: Insufficient documentation

## 2018-02-01 MED ORDER — FLUCONAZOLE 150 MG PO TABS: 150.0000 mg | ORAL_TABLET | ORAL | 0 refills | Status: DC

## 2018-02-01 NOTE — Progress Notes (Signed)
   PRENATAL VISIT NOTE Subjective:  Kimberly Colon is a 27 y.o. G2P0010 at [redacted]w[redacted]d being seen today for ongoing prenatal care.  She is currently monitored for the following issues for this high-risk pregnancy and has Polycystic kidney disease; Supervision of high risk pregnancy, antepartum; Hereditary disease in family possibly affecting fetus, fetus 1; and Genital warts complicating pregnancy, third trimester on their problem list.  - still having lots of pain and discomfort related to discharge and warts - not noticed anymore bleeding  - not having sex, hasn't been putting anything in vagina  - using mild soap, nothing actually in vagina   - U/S today with normal interval growth    Contractions: Not present. Vag. Bleeding: None.  Movement: Present. Denies leaking of fluid.   The following portions of the patient's history were reviewed and updated as appropriate: allergies, current medications, past family history, past medical history, past social history, past surgical history and problem list. Problem list updated.  Objective:   Vitals:   02/01/18 0931  BP: 128/73  Pulse: (!) 106  Weight: 187 lb (84.8 kg)    Fetal Status: Fetal Heart Rate (bpm): 144 Fundal Height: 31 cm Movement: Present     General:  Alert, oriented and cooperative. Patient is in no acute distress.  Skin: Skin is warm and dry. No rash noted.   Cardiovascular: Normal heart rate noted  Respiratory: Normal respiratory effort, no problems with respiration noted  Abdomen: Soft, gravid, appropriate for gestational age.  Pain/Pressure: Present     Pelvic: copious white/yellow, clumpy discharge  multiple small warts in vaginal canal        Extremities: Normal range of motion.  Edema: Trace  Mental Status: Normal mood and affect. Normal behavior. Normal judgment and thought content.   Assessment and Plan:  Pregnancy: G2P0010 at [redacted]w[redacted]d  1. Supervision of high risk pregnancy, antepartum -- prenatal care reviewed and  UTD  2. PCKD -- normal repeat growth U/S today   3. Vaginal Candidiasis -- repeat treatment with Diflucan 150mg  x 3 doses every 72 hours as unable to use applicator for cream because of irritation to warts and bleeding   Preterm labor symptoms and general obstetric precautions including but not limited to vaginal bleeding, contractions, leaking of fluid and fetal movement were reviewed in detail with the patient.Please refer to After Visit Summary for other counseling recommendations.   Return in about 2 weeks (around 02/15/2018) for ROB.  Future Appointments  Date Time Provider [place]  02/14/2018  3:35 PM Minette Brine [facility] [place]  02/28/2018  2:35 PM Tamala Julian, [place], [place], [place]

## 2018-02-02 LAB — CERVICOVAGINAL ANCILLARY ONLY
Bacterial vaginitis: POSITIVE — AB
Candida vaginitis: POSITIVE — AB

## 2018-02-14 ENCOUNTER — Ambulatory Visit (INDEPENDENT_AMBULATORY_CARE_PROVIDER_SITE_OTHER): Payer: 59 | Admitting: Advanced Practice Midwife

## 2018-02-14 ENCOUNTER — Other Ambulatory Visit (HOSPITAL_COMMUNITY)
Admission: RE | Admit: 2018-02-14 | Discharge: 2018-02-14 | Disposition: A | Discharge status: Home / Self Care | Payer: 59 | Source: Ambulatory Visit | Attending: Advanced Practice Midwife | Admitting: Advanced Practice Midwife

## 2018-02-14 ENCOUNTER — Encounter: Payer: Self-pay | Admitting: Advanced Practice Midwife

## 2018-02-14 VITALS — BP 133/76 | HR 105 | Wt 188.4 lb

## 2018-02-14 DIAGNOSIS — B3731 Acute candidiasis of vulva and vagina: Secondary | ICD-10-CM

## 2018-02-14 DIAGNOSIS — Q613 Polycystic kidney, unspecified: Secondary | ICD-10-CM

## 2018-02-14 DIAGNOSIS — O0993 Supervision of high risk pregnancy, unspecified, third trimester: Secondary | ICD-10-CM

## 2018-02-14 DIAGNOSIS — R030 Elevated blood-pressure reading, without diagnosis of hypertension: Secondary | ICD-10-CM

## 2018-02-14 DIAGNOSIS — O0990 Supervision of high risk pregnancy, unspecified, unspecified trimester: Secondary | ICD-10-CM

## 2018-02-14 DIAGNOSIS — B373 Candidiasis of vulva and vagina: Secondary | ICD-10-CM | POA: Diagnosis present

## 2018-02-14 MED ORDER — TERCONAZOLE 0.4 % VA CREA: 1.0000 | TOPICAL_CREAM | Freq: Every day | VAGINAL | 3 refills | Status: DC

## 2018-02-14 NOTE — Progress Notes (Signed)
Pt reports having light streaks of blood in yellow discharge.

## 2018-02-14 NOTE — Progress Notes (Signed)
   PRENATAL VISIT NOTE  Subjective:  Kimberly Colon is a 27 y.o. G2P0010 at [redacted]w[redacted]d being seen today for ongoing prenatal care.  She is currently monitored for the following issues for this low-risk pregnancy and has Polycystic kidney disease; Supervision of high risk pregnancy, antepartum; Hereditary disease in family possibly affecting fetus, fetus 1; and Genital warts complicating pregnancy, third trimester on their problem list.  Patient reports no leaking, vaginal irritation and seeing streaks of blood w/ wiping. .  Contractions: Irritability. Vag. Bleeding: Scant.  Movement: Present. Denies leaking of fluid.   The following portions of the patient's history were reviewed and updated as appropriate: allergies, current medications, past family history, past medical history, past social history, past surgical history and problem list. Problem list updated.  Objective:   Vitals:   02/14/18 1629  BP: 133/76  Pulse: (!) 105  Weight: 188 lb 6.4 oz (85.5 kg)    Fetal Status: Fetal Heart Rate (bpm): 141 Fundal Height: 32 cm Movement: Present  Presentation: Vertex  General:  Alert, oriented and cooperative. Patient is in no acute distress.  Skin: Skin is warm and dry. No rash noted.   Cardiovascular: Normal heart rate noted  Respiratory: Normal respiratory effort, no problems with respiration noted  Abdomen: Soft, gravid, appropriate for gestational age.  Pain/Pressure: Present     Pelvic: Cervical exam performed multiple genital warts, some inflamed and w/ the appearance of recent bleeding. No active bleeding Large amount if thick, white, curdlike discharge. Vaginal walls erythematous. Cervix closed. No bleeding from os or blood in vault.. .   Extremities: Normal range of motion.  Edema: Trace  Mental Status: Normal mood and affect. Normal behavior. Normal judgment and thought content.   Assessment and Plan:  Pregnancy: G2P0010 at [redacted]w[redacted]d  1. Supervision of high risk pregnancy,  antepartum   2. Polycystic kidney disease - Growth [place] in 4 weeks.  3. Third Trimester bleeding--from irritated genital warts. 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 During pregnancy w/ best results from Terazol.  - Terazol w/ refills.    Preterm labor symptoms and general obstetric precautions including but not limited to vaginal bleeding, contractions, leaking of fluid and fetal movement were reviewed in detail with the patient. Please refer to After Visit Summary for other counseling recommendations.  Return in about 2 weeks (around 02/28/2018).  Future Appointments  Date Time Provider [place]  02/28/2018  2:35 PM [place], [place], [place], [place]

## 2018-02-16 DIAGNOSIS — R030 Elevated blood-pressure reading, without diagnosis of hypertension: Secondary | ICD-10-CM | POA: Insufficient documentation

## 2018-02-16 LAB — CERVICOVAGINAL ANCILLARY ONLY
BACTERIAL VAGINITIS: POSITIVE — AB
CANDIDA VAGINITIS: POSITIVE — AB
CHLAMYDIA, DNA PROBE: NEGATIVE
NEISSERIA GONORRHEA: NEGATIVE
TRICH (WINDOWPATH): NEGATIVE

## 2018-02-28 ENCOUNTER — Ambulatory Visit (INDEPENDENT_AMBULATORY_CARE_PROVIDER_SITE_OTHER): Payer: 59 | Admitting: Advanced Practice Midwife

## 2018-02-28 VITALS — BP 132/77 | HR 94 | Wt 192.8 lb

## 2018-02-28 DIAGNOSIS — B9689 Other specified bacterial agents as the cause of diseases classified elsewhere: Secondary | ICD-10-CM

## 2018-02-28 DIAGNOSIS — O26893 Other specified pregnancy related conditions, third trimester: Secondary | ICD-10-CM

## 2018-02-28 DIAGNOSIS — N760 Acute vaginitis: Secondary | ICD-10-CM

## 2018-02-28 DIAGNOSIS — O0990 Supervision of high risk pregnancy, unspecified, unspecified trimester: Secondary | ICD-10-CM

## 2018-02-28 DIAGNOSIS — Q613 Polycystic kidney, unspecified: Secondary | ICD-10-CM

## 2018-02-28 LAB — POCT URINALYSIS DIP (DEVICE)
BILIRUBIN URINE: NEGATIVE
GLUCOSE, UA: NEGATIVE mg/dL
Ketones, ur: NEGATIVE mg/dL
NITRITE: NEGATIVE
Protein, ur: NEGATIVE mg/dL
Specific Gravity, Urine: 1.015 (ref 1.005–1.030)
UROBILINOGEN UA: 0.2 mg/dL (ref 0.0–1.0)
pH: 7 (ref 5.0–8.0)

## 2018-02-28 MED ORDER — METRONIDAZOLE 500 MG PO TABS: 500.0000 mg | ORAL_TABLET | Freq: Two times a day (BID) | ORAL | 0 refills | Status: DC

## 2018-02-28 NOTE — Patient Instructions (Addendum)
Preeclampsia and Eclampsia Preeclampsia is a serious condition that develops only during pregnancy. It is also called toxemia of pregnancy. This condition causes high blood pressure along with other symptoms, such as swelling and headaches. These symptoms may develop as the condition gets worse. Preeclampsia may occur at 20 weeks of pregnancy or later. Diagnosing and treating preeclampsia early is very important. If not treated early, it can cause serious problems for you and your baby. One problem it can lead to is eclampsia, which is a condition that causes muscle jerking or shaking (convulsions or seizures) in the mother. Delivering your baby is the best treatment for preeclampsia or eclampsia. Preeclampsia and eclampsia symptoms usually go away after your baby is born. What are the causes? The cause of preeclampsia is not known. What increases the risk? The following risk factors make you more likely to develop preeclampsia:  Being pregnant for the first time.  Having had preeclampsia during a past pregnancy.  Having a family history of preeclampsia.  Having high blood pressure.  Being pregnant with twins or triplets.  Being 46 or older.  Being African-American.  Having kidney disease or diabetes.  Having medical conditions such as lupus or blood diseases.  Being very overweight (obese).  What are the signs or symptoms? The earliest signs of preeclampsia are:  High blood pressure.  Increased protein in your urine. Your health care provider will check for this at every visit before you give birth (prenatal visit).  Other symptoms that may develop as the condition gets worse include:  Severe headaches.  Sudden weight gain.  Swelling of the hands, face, legs, and feet.  Nausea and vomiting.  Vision problems, such as blurred or double vision.  Numbness in the face, arms, legs, and feet.  Urinating less than usual.  Dizziness.  Slurred speech.  Abdominal pain,  especially upper abdominal pain.  Convulsions or seizures.  Symptoms generally go away after giving birth. How is this diagnosed? There are no screening tests for preeclampsia. Your health care provider will ask you about symptoms and check for signs of preeclampsia during your prenatal visits. You may also have tests that include:  Urine tests.  Blood tests.  Checking your blood pressure.  Monitoring your baby's heart rate.  Ultrasound.  How is this treated? You and your health care provider will determine the treatment approach that is best for you. Treatment may include:  Having more frequent prenatal exams to check for signs of preeclampsia, if you have an increased risk for preeclampsia.  Bed rest.  Reducing how much salt (sodium) you eat.  Medicine to lower your blood pressure.  Staying in the hospital, if your condition is severe. There, treatment will focus on controlling your blood pressure and the amount of fluids in your body (fluid retention).  You may need to take medicine (magnesium sulfate) to prevent seizures. This medicine may be given as an injection or through an IV tube.  Delivering your baby early, if your condition gets worse. You may have your labor started with medicine (induced), or you may have a cesarean delivery.  Follow these instructions at home: Eating and drinking   Drink enough fluid to keep your urine clear or pale yellow.  Eat a healthy diet that is low in sodium. Do not add salt to your food. Check nutrition labels to see how much sodium a food or beverage contains.  Avoid caffeine. Lifestyle  Do not use any products that contain nicotine or tobacco, such as cigarettes  and e-cigarettes. If you need help quitting, ask your health care provider.  Do not use alcohol or drugs.  Avoid stress as much as possible. Rest and get plenty of sleep. General instructions  Take over-the-counter and prescription medicines only as told by your  health care provider.  When lying down, lie on your side. This keeps pressure off of your baby.  When sitting or lying down, raise (elevate) your feet. Try putting some pillows underneath your lower legs.  Exercise regularly. Ask your health care provider what kinds of exercise are best for you.  Keep all follow-up and prenatal visits as told by your health care provider. This is important. How is this prevented? To prevent preeclampsia or eclampsia from developing during another pregnancy:  Get proper medical care during pregnancy. Your health care provider may be able to prevent preeclampsia or diagnose and treat it early.  Your health care provider may have you take a low-dose aspirin or a calcium supplement during your next pregnancy.  You may have tests of your blood pressure and kidney function after giving birth.  Maintain a healthy weight. Ask your health care provider for help managing weight gain during pregnancy.  Work with your health care provider to manage any long-term (chronic) health conditions you have, such as diabetes or kidney problems.  Contact a health care provider if:  You gain more weight than expected.  You have headaches.  You have nausea or vomiting.  You have abdominal pain.  You feel dizzy or light-headed. Get help right away if:  You develop sudden or severe swelling anywhere in your body. This usually happens in the legs.  You gain 5 lbs (2.3 kg) or more during one week.  You have severe: ? Abdominal pain. ? Headaches. ? Dizziness. ? Vision problems. ? Confusion. ? Nausea or vomiting.  You have a seizure.  You have trouble moving any part of your body.  You develop numbness in any part of your body.  You have trouble speaking.  You have any abnormal bleeding.  You pass out. This information is not intended to replace advice given to you by your health care provider. Make sure you discuss any questions you have with your health  care provider. Document Released: 03/19/2000 Document Revised: 11/18/2015 Document Reviewed: 10/27/2015 Elsevier Interactive Patient Education  2018 [employer].   Breastfeeding Choosing to breastfeed is one of the best decisions you can make for yourself and your baby. A change in hormones during pregnancy causes your breasts to make breast milk in your milk-producing glands. Hormones prevent breast milk from being released before your baby is born. They also prompt milk flow after birth. Once breastfeeding has begun, thoughts of your baby, as well as his or her sucking or crying, can stimulate the release of milk from your milk-producing glands. Benefits of breastfeeding Research shows that breastfeeding offers many health benefits for infants and mothers. It also offers a cost-free and convenient way to feed your baby. For your baby  Your first milk (colostrum) helps your baby's digestive system to function better.  Special cells in your milk (antibodies) help your baby to fight off infections.  Breastfed babies are less likely to develop asthma, allergies, obesity, or type 2 diabetes. They are also at lower risk for sudden infant death syndrome (SIDS).  Nutrients in breast milk are better able to meet your baby's needs compared to infant formula.  Breast milk improves your baby's brain development. For you  Breastfeeding helps to  create a very special bond between you and your baby.  Breastfeeding is convenient. Breast milk costs nothing and is always available at the correct temperature.  Breastfeeding helps to burn calories. It helps you to lose the weight that you gained during pregnancy.  Breastfeeding makes your uterus return faster to its size before pregnancy. It also slows bleeding (lochia) after you give birth.  Breastfeeding helps to lower your risk of developing type 2 diabetes, osteoporosis, rheumatoid arthritis, cardiovascular disease, and breast, ovarian, uterine,  and endometrial cancer later in life. Breastfeeding basics Starting breastfeeding  Find a comfortable place to sit or lie down, with your neck and back well-supported.  Place a pillow or a rolled-up blanket under your baby to bring him or her to the level of your breast (if you are seated). Nursing pillows are specially designed to help support your arms and your baby while you breastfeed.  Make sure that your baby's tummy (abdomen) is facing your abdomen.  Gently massage your breast. With your fingertips, massage from the outer edges of your breast inward toward the nipple. This encourages milk flow. If your milk flows slowly, you may need to continue this action during the feeding.  Support your breast with 4 fingers underneath and your thumb above your nipple (make the letter "C" with your hand). Make sure your fingers are well away from your nipple and your baby's mouth.  Stroke your baby's lips gently with your finger or nipple.  When your baby's mouth is open wide enough, quickly bring your baby to your breast, placing your entire nipple and as much of the areola as possible into your baby's mouth. The areola is the colored area around your nipple. ? More areola should be visible above your baby's upper lip than below the lower lip. ? Your baby's lips should be opened and extended outward (flanged) to ensure an adequate, comfortable latch. ? Your baby's tongue should be between his or her lower gum and your breast.  Make sure that your baby's mouth is correctly positioned around your nipple (latched). Your baby's lips should create a seal on your breast and be turned out (everted).  It is common for your baby to suck about 2-3 minutes in order to start the flow of breast milk. Latching Teaching your baby how to latch onto your breast properly is very important. An improper latch can cause nipple pain, decreased milk supply, and poor weight gain in your baby. Also, if your baby is not  latched onto your nipple properly, he or she may swallow some air during feeding. This can make your baby fussy. Burping your baby when you switch breasts during the feeding can help to get rid of the air. However, teaching your baby to latch on properly is still the best way to prevent fussiness from swallowing air while breastfeeding. Signs that your baby has successfully latched onto your nipple  Silent tugging or silent sucking, without causing you pain. Infant's lips should be extended outward (flanged).  Swallowing heard between every 3-4 sucks once your milk has started to flow (after your let-down milk reflex occurs).  Muscle movement above and in front of his or her ears while sucking.  Signs that your baby has not successfully latched onto your nipple  Sucking sounds or smacking sounds from your baby while breastfeeding.  Nipple pain.  If you think your baby has not latched on correctly, slip your finger into the corner of your baby's mouth to break the suction  and place it between your baby's gums. Attempt to start breastfeeding again. Signs of successful breastfeeding Signs from your baby  Your baby will gradually decrease the number of sucks or will completely stop sucking.  Your baby will fall asleep.  Your baby's body will relax.  Your baby will retain a small amount of milk in his or her mouth.  Your baby will let go of your breast by himself or herself.  Signs from you  Breasts that have increased in firmness, weight, and size 1-3 hours after feeding.  Breasts that are softer immediately after breastfeeding.  Increased milk volume, as well as a change in milk consistency and color by the fifth day of breastfeeding.  Nipples that are not sore, cracked, or bleeding.  Signs that your baby is getting enough milk  Wetting at least 1-2 diapers during the first 24 hours after birth.  Wetting at least 5-6 diapers every 24 hours for the first week after birth. The  urine should be clear or pale yellow by the age of 5 days.  Wetting 6-8 diapers every 24 hours as your baby continues to grow and develop.  At least 3 stools in a 24-hour period by the age of 5 days. The stool should be soft and yellow.  At least 3 stools in a 24-hour period by the age of 7 days. The stool should be seedy and yellow.  No loss of weight greater than 10% of birth weight during the first 3 days of life.  Average weight gain of 4-7 oz (113-198 g) per week after the age of 4 days.  Consistent daily weight gain by the age of 5 days, without weight loss after the age of 2 weeks. After a feeding, your baby may spit up a small amount of milk. This is normal. Breastfeeding frequency and duration Frequent feeding will help you make more milk and can prevent sore nipples and extremely full breasts (breast engorgement). Breastfeed when you feel the need to reduce the fullness of your breasts or when your baby shows signs of hunger. This is called "breastfeeding on demand." Signs that your baby is hungry include:  Increased alertness, activity, or restlessness.  Movement of the head from side to side.  Opening of the mouth when the corner of the mouth or cheek is stroked (rooting).  Increased sucking sounds, smacking lips, cooing, sighing, or squeaking.  Hand-to-mouth movements and sucking on fingers or hands.  Fussing or crying.  Avoid introducing a pacifier to your baby in the first 4-6 weeks after your baby is born. After this time, you may choose to use a pacifier. Research has shown that pacifier use during the first year of a baby's life decreases the risk of sudden infant death syndrome (SIDS). Allow your baby to feed on each breast as long as he or she wants. When your baby unlatches or falls asleep while feeding from the first breast, offer the second breast. Because newborns are often sleepy in the first few weeks of life, you may need to awaken your baby to get him or her  to feed. Breastfeeding times will vary from baby to baby. However, the following rules can serve as a guide to help you make sure that your baby is properly fed:  Newborns (babies 51 weeks of age or younger) may breastfeed every 1-3 hours.  Newborns should not go without breastfeeding for longer than 3 hours during the day or 5 hours during the night.  You should breastfeed  your baby a minimum of 8 times in a 24-hour period.  Breast milk pumping Pumping and storing breast milk allows you to make sure that your baby is exclusively fed your breast milk, even at times when you are unable to breastfeed. This is especially important if you go back to work while you are still breastfeeding, or if you are not able to be present during feedings. Your lactation consultant can help you find a method of pumping that works best for you and give you guidelines about how long it is safe to store breast milk. Caring for your breasts while you breastfeed Nipples can become dry, cracked, and sore while breastfeeding. The following recommendations can help keep your breasts moisturized and healthy:  Avoid using soap on your nipples.  Wear a supportive bra designed especially for nursing. Avoid wearing underwire-style bras or extremely tight bras (sports bras).  Air-dry your nipples for 3-4 minutes after each feeding.  Use only cotton bra pads to absorb leaked breast milk. Leaking of breast milk between feedings is normal.  Use lanolin on your nipples after breastfeeding. Lanolin helps to maintain your skin's normal moisture barrier. Pure lanolin is not harmful (not toxic) to your baby. You may also hand express a few drops of breast milk and gently massage that milk into your nipples and allow the milk to air-dry.  In the first few weeks after giving birth, some women experience breast engorgement. Engorgement can make your breasts feel heavy, warm, and tender to the touch. Engorgement peaks within 3-5 days  after you give birth. The following recommendations can help to ease engorgement:  Completely empty your breasts while breastfeeding or pumping. You may want to start by applying warm, moist heat (in the shower or with warm, water-soaked hand towels) just before feeding or pumping. This increases circulation and helps the milk flow. If your baby does not completely empty your breasts while breastfeeding, pump any extra milk after he or she is finished.  Apply ice packs to your breasts immediately after breastfeeding or pumping, unless this is too uncomfortable for you. To do this: ? Put ice in a plastic bag. ? Place a towel between your skin and the bag. ? Leave the ice on for 20 minutes, 2-3 times a day.  Make sure that your baby is latched on and positioned properly while breastfeeding.  If engorgement persists after 48 hours of following these recommendations, contact your health care provider or a Science writer. Overall health care recommendations while breastfeeding  Eat 3 healthy meals and 3 snacks every day. Well-nourished mothers who are breastfeeding need an additional 450-500 calories a day. You can meet this requirement by increasing the amount of a balanced diet that you eat.  Drink enough water to keep your urine pale yellow or clear.  Rest often, relax, and continue to take your prenatal vitamins to prevent fatigue, stress, and low vitamin and mineral levels in your body (nutrient deficiencies).  Do not use any products that contain nicotine or tobacco, such as cigarettes and e-cigarettes. Your baby may be harmed by chemicals from cigarettes that pass into breast milk and exposure to secondhand smoke. If you need help quitting, ask your health care provider.  Avoid alcohol.  Do not use illegal drugs or marijuana.  Talk with your health care provider before taking any medicines. These include over-the-counter and prescription medicines as well as vitamins and herbal  supplements. Some medicines that may be harmful to your baby can pass through  breast milk.  It is possible to become pregnant while breastfeeding. If birth control is desired, ask your health care provider about options that will be safe while breastfeeding your baby. Where to find more information: [employer]: www.llli.org Contact a health care provider if:  You feel like you want to stop breastfeeding or have become frustrated with breastfeeding.  Your nipples are cracked or bleeding.  Your breasts are red, tender, or warm.  You have: ? Painful breasts or nipples. ? A swollen area on either breast. ? A fever or chills. ? Nausea or vomiting. ? Drainage other than breast milk from your nipples.  Your breasts do not become full before feedings by the fifth day after you give birth.  You feel sad and depressed.  Your baby is: ? Too sleepy to eat well. ? Having trouble sleeping. ? More than 26 week old and wetting fewer than 6 diapers in a 24-hour period. ? Not gaining weight by 45 days of age.  Your baby has fewer than 3 stools in a 24-hour period.  Your baby's skin or the white parts of his or her eyes become yellow. Get help right away if:  Your baby is overly tired (lethargic) and does not want to wake up and feed.  Your baby develops an unexplained fever. Summary  Breastfeeding offers many health benefits for infant and mothers.  Try to breastfeed your infant when he or she shows early signs of hunger.  Gently tickle or stroke your baby's lips with your finger or nipple to allow the baby to open his or her mouth. Bring the baby to your breast. Make sure that much of the areola is in your baby's mouth. Offer one side and burp the baby before you offer the other side.  Talk with your health care provider or lactation consultant if you have questions or you face problems as you breastfeed. This information is not intended to replace advice given to you  by your health care provider. Make sure you discuss any questions you have with your health care provider. Document Released: 03/22/2005 Document Revised: 04/23/2016 Document Reviewed: 04/23/2016 Elsevier Interactive Patient Education  [employer].

## 2018-02-28 NOTE — Progress Notes (Signed)
   PRENATAL VISIT NOTE  Subjective:  Kimberly Colon is a 27 y.o. G2P0010 at [redacted]w[redacted]d being seen today for ongoing prenatal care.  She is currently monitored for the following issues for this high-risk pregnancy and has Polycystic kidney disease; Supervision of high risk pregnancy, antepartum; Hereditary disease in family possibly affecting fetus, fetus 1; Genital warts complicating pregnancy, third trimester; and Transient elevated blood pressure on their problem list.  Patient reports no complaints.  Contractions: Not present. Vag. Bleeding: Scant.  Movement: Present. Denies leaking of fluid.   The following portions of the patient's history were reviewed and updated as appropriate: allergies, current medications, past family history, past medical history, past social history, past surgical history and problem list. Problem list updated.  Objective:   Vitals:   02/28/18 1535  BP: 132/77  Pulse: 94  Weight: 192 lb 12.8 oz (87.5 kg)    Fetal Status: Fetal Heart Rate (bpm): 139 Fundal Height: 36 cm Movement: Present  Presentation: Vertex  General:  Alert, oriented and cooperative. Patient is in no acute distress.  Skin: Skin is warm and dry. No rash noted.   Cardiovascular: Normal heart rate noted  Respiratory: Normal respiratory effort, no problems with respiration noted  Abdomen: Soft, gravid, appropriate for gestational age.  Pain/Pressure: Present     Pelvic: Cervical exam deferred        Extremities: Normal range of motion.  Edema: Trace  Mental Status: Normal mood and affect. Normal behavior. Normal judgment and thought content.   Assessment and Plan:  Pregnancy: G2P0010 at [redacted]w[redacted]d  1. Supervision of high risk pregnancy, antepartum   2. Polycystic kidney disease   Preterm labor symptoms and general obstetric precautions including but not limited to vaginal bleeding, contractions, leaking of fluid and fetal movement were reviewed in detail with the patient. Please refer to  After Visit Summary for other counseling recommendations.  Return in about 1 week (around 03/07/2018) for ROB/GBS.  Future Appointments  Date Time Provider [place]  03/07/2018  3:15 PM Brown Human [facility] [place]  03/14/2018  3:15 PM Manya Silvas, [place]  03/21/2018  3:15 PM Minette Brine [facility] [place]  03/30/2018  3:15 PM Ardean Larsen, Mervyn Skeeters, [place], [place]

## 2018-03-07 ENCOUNTER — Ambulatory Visit (INDEPENDENT_AMBULATORY_CARE_PROVIDER_SITE_OTHER): Payer: 59 | Admitting: Student

## 2018-03-07 ENCOUNTER — Other Ambulatory Visit (HOSPITAL_COMMUNITY)
Admission: RE | Admit: 2018-03-07 | Discharge: 2018-03-07 | Disposition: A | Discharge status: Home / Self Care | Payer: 59 | Source: Ambulatory Visit | Attending: Student | Admitting: Student

## 2018-03-07 VITALS — BP 136/86 | HR 96 | Wt 196.5 lb

## 2018-03-07 DIAGNOSIS — O0993 Supervision of high risk pregnancy, unspecified, third trimester: Secondary | ICD-10-CM

## 2018-03-07 DIAGNOSIS — O0990 Supervision of high risk pregnancy, unspecified, unspecified trimester: Secondary | ICD-10-CM | POA: Insufficient documentation

## 2018-03-07 DIAGNOSIS — O368130 Decreased fetal movements, third trimester, not applicable or unspecified: Secondary | ICD-10-CM | POA: Diagnosis not present

## 2018-03-07 LAB — OB RESULTS CONSOLE GC/CHLAMYDIA: GC PROBE AMP, GENITAL: NEGATIVE

## 2018-03-07 LAB — OB RESULTS CONSOLE GBS: GBS: NEGATIVE

## 2018-03-07 NOTE — Addendum Note (Signed)
Addended by: Samuel Germany on: 03/07/2018 05:53 PM   Modules accepted: Orders

## 2018-03-07 NOTE — Progress Notes (Signed)
Addendum: 5:25pm   Came back as requested by provider for NST.

## 2018-03-07 NOTE — Patient Instructions (Addendum)
CIRCUMCISION  Circumcision is considered an elective/non-medically necessary procedure. There are many reasons parents decide to have their sons circumsized. During the first year of life circumcised males have a reduced risk of urinary tract infections but after this year the rates between circumcised males and uncircumcised males are the same.  It is safe to have your son circumcised outside of the hospital and the places above perform them regularly.    Places to have your son circumcised:    [facility] [phone] $480 by 4 wks  [facility] [phone] $244 by 4 wks  [facility] [phone] $175 by 2 wks  [facility] [phone] $250 by 7 days [facility] [phone] $150 by 4 wks  These prices sometimes change but are roughly what you can expect to pay. Please call and confirm pricing.    [facility] [place] [address] [place], [place]  [address] Phone - [phone]   Fax - [phone]  [facility] [place] [address] [place], [place] [address] Phone - [phone]   Fax - [place] [address] [place], [place]  [address] Phone - [phone]   Fax - [phone]  Maunie Hope. [address], [place] [address] [place], [place]  [address] Phone - [phone]   Fax - [phone]  [place] [address] [place], [place]  [address] Phone - [phone]   Fax - [phone]  [facility] [address] [place], [place]  [address] Phone - [phone]   Fax - [place] [address], [place], [place]  [address] Phone - [phone]   Fax - [phone]  [place] [address]  [place], [place] [address] [place], [place]  [address] Phone - [phone]   Fax - [place] [address] [place], [place]  [address] Phone - [phone]   Fax - [phone] [facility] [place]. [address] [place], [place]  [address] Phone - [phone]   Fax - [phone]  [facility] [place] [address] [place], [place]  [address] Phone - [phone]   Fax - [phone]  [facility] [place], [place], [place]  [address] Phone - [phone]   Fax - [place] [address], [place], [place]  [address] Phone - [phone]   Fax - [phone]  [facility] [address] [place], [place]  [address] Phone - [place], [place]  [address] Phone - [phone]   Fax - [place] [address] [place], [place]  [address] Phone - [phone]   Fax - [phone]  [place] [address] [place], [place]  [address] Phone - [phone]   Fax - [place], [place]  [address] Phone - [phone]   Fax - [place], [place], [place]  [address] Phone - [phone]   Fax - [place] [address], [place], [place]  [address] Phone - [phone]   Fax - [phone]  DAVID RUBIN [address] [place],   [address] Phone - [phone]   Fax - [phone]  [facility] [place] [address] [place], [place]  [address] Phone - [phone]   Fax - [phone]  [place] [address] [place], [place]  [address] Phone - [phone]   Fax - [phone] Arnaldo Natal [address]  [place], [place]  [address] Phone - [phone]   Fax - [place] [address] [place], [place]  [address] Phone - [phone]   Fax - [place] [address] [place],   [address] Phone - [phone]   Fax - [phone]  [place] MD [address] [place] [address] Phone [phone]  Fax [phone]

## 2018-03-07 NOTE — Progress Notes (Addendum)
   PRENATAL VISIT NOTE  Subjective:  Kimberly Colon is a 27 y.o. G2P0010 at [redacted]w[redacted]d being seen today for ongoing prenatal care.  She is currently monitored for the following issues for this low-risk pregnancy and has Polycystic kidney disease; Supervision of high risk pregnancy, antepartum; Hereditary disease in family possibly affecting fetus, fetus 1; Genital warts complicating pregnancy, third trimester; and Transient elevated blood pressure on their problem list.  Patient reports that she feels like baby is not moving regularly.  Contractions: Not present. Vag. Bleeding: None.  Movement: (!) Decreased. Denies leaking of fluid.   The following portions of the patient's history were reviewed and updated as appropriate: allergies, current medications, past family history, past medical history, past social history, past surgical history and problem list. Problem list updated.  Objective:   Vitals:   03/07/18 1545  BP: 136/86  Pulse: 96  Weight: 196 lb 8 oz (89.1 kg)    Fetal Status: Fetal Heart Rate (bpm): 150 Fundal Height: 37 cm Movement: (!) Decreased  Presentation: Vertex  General:  Alert, oriented and cooperative. Patient is in no acute distress.  Skin: Skin is warm and dry. No rash noted.   Cardiovascular: Normal heart rate noted  Respiratory: Normal respiratory effort, no problems with respiration noted  Abdomen: Soft, gravid, appropriate for gestational age.  Pain/Pressure: Present     Pelvic: Cervical exam performed        Extremities: Normal range of motion.  Edema: Trace  Mental Status: Normal mood and affect. Normal behavior. Normal judgment and thought content.   Assessment and Plan:  Pregnancy: G2P0010 at [redacted]w[redacted]d  1. Supervision of high risk pregnancy, antepartum -Patient will stop taking baby ASA - Culture, beta strep (group b only) - Cervicovaginal ancillary only -BP normal today -Cephalic presentation by [place] but cephalus is not descended in the pelvis at all; watch  position of baby in case baby turns breech  -Patient left before her NST; called patient and she will come back for NST this evening.   Term labor symptoms and general obstetric precautions including but not limited to vaginal bleeding, contractions, leaking of fluid and fetal movement were reviewed in detail with the patient. Please refer to After Visit Summary for other counseling recommendations.  Return in about 1 week (around 03/14/2018).  Future Appointments  Date Time Provider [place]  03/14/2018  3:15 PM Manya Silvas, [place] [facility] [place]  03/21/2018  3:15 PM Manya Silvas, [place]  03/30/2018  3:15 PM [place], [place]    [place], [place]

## 2018-03-07 NOTE — Progress Notes (Signed)
-  Patient had NST: 135 bpm, mod var, present acels, neg decels, no contractions. Patient movement in the room; discussed fetal kick counts and encouraged patient to return to MAU if she has any concerns about decreased fetal movements. Patient verbalized understanding.

## 2018-03-07 NOTE — Progress Notes (Signed)
Addendum , came back in for NST as requested. NST reactive and reviewed by Malva Limes.

## 2018-03-08 LAB — CERVICOVAGINAL ANCILLARY ONLY
Chlamydia: NEGATIVE
NEISSERIA GONORRHEA: NEGATIVE

## 2018-03-09 ENCOUNTER — Inpatient Hospital Stay (HOSPITAL_COMMUNITY)
Admission: AD | Admit: 2018-03-09 | Discharge: 2018-03-10 | Disposition: A | Discharge status: Home / Self Care | Payer: 59 | Attending: Obstetrics & Gynecology | Admitting: Obstetrics & Gynecology

## 2018-03-09 ENCOUNTER — Encounter (HOSPITAL_COMMUNITY): Payer: Self-pay

## 2018-03-09 DIAGNOSIS — O4690 Antepartum hemorrhage, unspecified, unspecified trimester: Secondary | ICD-10-CM

## 2018-03-09 DIAGNOSIS — N898 Other specified noninflammatory disorders of vagina: Secondary | ICD-10-CM

## 2018-03-09 DIAGNOSIS — O4693 Antepartum hemorrhage, unspecified, third trimester: Secondary | ICD-10-CM | POA: Diagnosis not present

## 2018-03-09 DIAGNOSIS — A630 Anogenital (venereal) warts: Secondary | ICD-10-CM | POA: Insufficient documentation

## 2018-03-09 DIAGNOSIS — Z3A37 37 weeks gestation of pregnancy: Secondary | ICD-10-CM | POA: Diagnosis not present

## 2018-03-09 DIAGNOSIS — O98313 Other infections with a predominantly sexual mode of transmission complicating pregnancy, third trimester: Secondary | ICD-10-CM | POA: Insufficient documentation

## 2018-03-09 LAB — URINALYSIS, ROUTINE W REFLEX MICROSCOPIC
Bilirubin Urine: NEGATIVE
Glucose, UA: NEGATIVE mg/dL
KETONES UR: NEGATIVE mg/dL
Nitrite: NEGATIVE
PROTEIN: NEGATIVE mg/dL
Specific Gravity, Urine: 1.008 (ref 1.005–1.030)
pH: 6 (ref 5.0–8.0)

## 2018-03-09 NOTE — MAU Note (Signed)
Used an applicator to treat a yeast infection at 2230.  When she pulled it out it had blood on it.  Then noticed more bleeding since then.  Passed 1 small clot.  + FM.  No ctx.  States she uses the yeast infection medications almost every night for > 2 weeks.

## 2018-03-10 LAB — CULTURE, BETA STREP (GROUP B ONLY): Strep Gp B Culture: NEGATIVE

## 2018-03-10 NOTE — MAU Note (Signed)
Pt signed paper AVS.

## 2018-03-10 NOTE — Discharge Instructions (Signed)
Genital Warts Genital warts are small growths in the genital area or anal area. They are caused by a type of germ (HPV virus). This germ is spread from person to person during sex. It can be spread through vaginal, anal, and oral sex. Genital warts can lead to other problems if they are not treated. Follow these instructions at home: Medicines  Apply over-the-counter and prescription medicines only as told by your doctor.  Do not use medicines that are meant for treating hand warts.  Talk with your doctor about using anti-itch creams. General instructions  Do not touch or scratch the warts.  Do not have sex until your treatment is done.  Tell your current and past sexual partners about your condition. They may need treatment.  Keep all follow-up visits as told by your doctor. This is important.  After treatment, use condoms during sex. Other Instructions for Women  Women who have genital warts may need to be checked more often for cervical cancer.  If you become pregnant, tell your doctor that you have had genital warts. The germ can be passed to the baby. Contact a doctor if:  You have redness, swelling, or pain in the area of the treated skin.  You have a fever.  You feel generally sick.  You feel lumps in and around your genital area or anal area.  You have bleeding in your genital area or anal area.  You have pain during sex. This information is not intended to replace advice given to you by your health care provider. Make sure you discuss any questions you have with your health care provider. Document Released: 06/16/2009 Document Revised: 08/28/2015 Document Reviewed: 06/17/2014 Elsevier Interactive Patient Education  2018 [employer].  Vaginal Bleeding During Pregnancy, Third Trimester A small amount of bleeding (spotting) from the vagina is common in pregnancy. Sometimes the bleeding is normal and is not a problem, and sometimes it is a sign of something serious.  Be sure to tell your doctor about any bleeding from your vagina right away. Follow these instructions at home:  Watch your condition for any changes.  Follow your doctor's instructions about how active you can be.  If you are on bed rest: ? You may need to stay in bed and only get up to use the bathroom. ? You may be allowed to do some activities. ? If you need help, make plans for someone to help you.  Write down: ? The number of pads you use each day. ? How often you change pads. ? How soaked (saturated) your pads are.  Do not use tampons.  Do not douche.  Do not have sex or orgasms until your doctor says it is okay.  Follow your doctor's advice about lifting, driving, and doing physical activities.  If you pass any tissue from your vagina, save the tissue so you can show it to your doctor.  Only take medicines as told by your doctor.  Do not take aspirin because it can make you bleed.  Keep all follow-up visits as told by your doctor. Contact a doctor if:  You bleed from your vagina.  You have cramps.  You have labor pains.  You have a fever that does not go away after you take medicine. Get help right away if:  You have very bad cramps in your back or belly (abdomen).  You have chills.  You have a gush of fluid from your vagina.  You pass large clots or tissue from your  vagina.  You bleed more.  You feel light-headed or weak.  You pass out (faint).  You do not feel your baby move around as much as before. This information is not intended to replace advice given to you by your health care provider. Make sure you discuss any questions you have with your health care provider. Document Released: 08/06/2013 Document Revised: 08/28/2015 Document Reviewed: 11/27/2012 Elsevier Interactive Patient Education  2018 [place].  Pelvic Rest Pelvic rest may be recommended if:  Your placenta is partially or completely covering the opening of your cervix  (placenta previa).  There is bleeding between the wall of the uterus and the amniotic sac in the first trimester of pregnancy (subchorionic hemorrhage).  You went into labor too early (preterm labor).  Based on your overall health and the health of your baby, your health care provider will decide if pelvic rest is right for you. How do I rest my pelvis? For as long as told by your health care provider:  Do not have sex, sexual stimulation, or an orgasm.  Do not use tampons. Do not douche. Do not put anything in your vagina.  Do not lift anything that is heavier than 10 lb (4.5 kg).  Avoid activities that take a lot of effort (are strenuous).  Avoid any activity in which your pelvic muscles could become strained.  When should I seek medical care? Seek medical care if you have:  Cramping pain in your lower abdomen.  Vaginal discharge.  A low, dull backache.  Regular contractions.  Uterine tightening.  When should I seek immediate medical care? Seek immediate medical care if:  You have vaginal bleeding and you are pregnant.  This information is not intended to replace advice given to you by your health care provider. Make sure you discuss any questions you have with your health care provider. Document Released: 07/17/2010 Document Revised: 08/28/2015 Document Reviewed: 09/23/2014 Elsevier Interactive Patient Education  [employer].

## 2018-03-14 ENCOUNTER — Inpatient Hospital Stay (HOSPITAL_COMMUNITY)
Admission: AD | Admit: 2018-03-14 | Discharge: 2018-03-14 | Disposition: A | Discharge status: Home / Self Care | Payer: 59 | Attending: Obstetrics & Gynecology | Admitting: Obstetrics & Gynecology

## 2018-03-14 ENCOUNTER — Encounter (HOSPITAL_COMMUNITY): Payer: Self-pay

## 2018-03-14 ENCOUNTER — Ambulatory Visit (INDEPENDENT_AMBULATORY_CARE_PROVIDER_SITE_OTHER): Payer: Medicaid Other | Admitting: Advanced Practice Midwife

## 2018-03-14 VITALS — BP 136/83 | HR 98 | Wt 195.6 lb

## 2018-03-14 DIAGNOSIS — O26893 Other specified pregnancy related conditions, third trimester: Secondary | ICD-10-CM

## 2018-03-14 DIAGNOSIS — O0993 Supervision of high risk pregnancy, unspecified, third trimester: Secondary | ICD-10-CM

## 2018-03-14 DIAGNOSIS — O0990 Supervision of high risk pregnancy, unspecified, unspecified trimester: Secondary | ICD-10-CM

## 2018-03-14 DIAGNOSIS — Z3A37 37 weeks gestation of pregnancy: Secondary | ICD-10-CM | POA: Diagnosis not present

## 2018-03-14 DIAGNOSIS — R030 Elevated blood-pressure reading, without diagnosis of hypertension: Secondary | ICD-10-CM

## 2018-03-14 DIAGNOSIS — O169 Unspecified maternal hypertension, unspecified trimester: Secondary | ICD-10-CM | POA: Diagnosis not present

## 2018-03-14 DIAGNOSIS — Q613 Polycystic kidney, unspecified: Secondary | ICD-10-CM

## 2018-03-14 DIAGNOSIS — O352XX1 Maternal care for (suspected) hereditary disease in fetus, fetus 1: Secondary | ICD-10-CM

## 2018-03-14 LAB — URINALYSIS, ROUTINE W REFLEX MICROSCOPIC
Bilirubin Urine: NEGATIVE
Glucose, UA: NEGATIVE mg/dL
Ketones, ur: NEGATIVE mg/dL
Nitrite: NEGATIVE
PH: 7 (ref 5.0–8.0)
Protein, ur: NEGATIVE mg/dL
SPECIFIC GRAVITY, URINE: 1.012 (ref 1.005–1.030)

## 2018-03-14 LAB — COMPREHENSIVE METABOLIC PANEL
ALT: 16 U/L (ref 0–44)
AST: 14 U/L — ABNORMAL LOW (ref 15–41)
Albumin: 3.3 g/dL — ABNORMAL LOW (ref 3.5–5.0)
Alkaline Phosphatase: 72 U/L (ref 38–126)
Anion gap: 9 (ref 5–15)
BUN: 11 mg/dL (ref 6–20)
CO2: 20 mmol/L — AB (ref 22–32)
Calcium: 9.3 mg/dL (ref 8.9–10.3)
Chloride: 106 mmol/L (ref 98–111)
Creatinine, Ser: 0.44 mg/dL (ref 0.44–1.00)
GFR calc Af Amer: >60 mL/min (ref >60)
GFR calc non Af Amer: >60 mL/min (ref >60)
Glucose, Bld: 80 mg/dL (ref 70–99)
POTASSIUM: 3.5 mmol/L (ref 3.5–5.1)
SODIUM: 135 mmol/L (ref 135–145)
Total Bilirubin: 0.7 mg/dL (ref 0.3–1.2)
Total Protein: 7 g/dL (ref 6.5–8.1)

## 2018-03-14 LAB — CBC
HCT: 33.9 % — ABNORMAL LOW (ref 36.0–46.0)
Hemoglobin: 11.3 g/dL — ABNORMAL LOW (ref 12.0–15.0)
MCH: 30.5 pg (ref 26.0–34.0)
MCHC: 33.3 g/dL (ref 30.0–36.0)
MCV: 91.4 fL (ref 80.0–100.0)
PLATELETS: 207 10*3/uL (ref 150–400)
RBC: 3.71 MIL/uL — ABNORMAL LOW (ref 3.87–5.11)
RDW: 13.7 % (ref 11.5–15.5)
WBC: 9.2 10*3/uL (ref 4.0–10.5)
nRBC: 0 % (ref 0.0–0.2)

## 2018-03-14 LAB — PROTEIN / CREATININE RATIO, URINE
Creatinine, Urine: 62 mg/dL
Protein Creatinine Ratio: 0.21 mg/mg{Cre} — ABNORMAL HIGH (ref 0.00–0.15)
Total Protein, Urine: 13 mg/dL

## 2018-03-14 NOTE — MAU Provider Note (Signed)
History     CSN: [id number]  Arrival date and time: 03/14/18 1645   First Provider Initiated Contact with Patient 03/14/18 1727      Chief Complaint  Patient presents with  . Hypertension   HPI Kimberly Colon is a 27 y.o. G2P0010 at [redacted]w[redacted]d who presents for evaluation of hypertension. She had one elevated BP at her appointment today. Denies any headache, visual changes or epigastric pain. Denies any pain, vaginal bleeding or leaking of fluid. Reports normal fetal movement. She also reports a new onset of diffuse itching over the last 2 days.   OB History    Gravida  2   Para      Term      Preterm      AB  1   Living        SAB      TAB  1   Ectopic      Multiple      Live Births           Obstetric Comments  G1: 2016 EAB, pills        Past Medical History:  Diagnosis Date  . Polycystic kidney disease   . Renal stone     Past Surgical History:  Procedure Laterality Date  . NO PAST SURGERIES      History reviewed. No pertinent family history.  Social History   Tobacco Use  . Smoking status: Never Smoker  . Smokeless tobacco: Never Used  Substance Use Topics  . Alcohol use: Not Currently  . Drug use: Not Currently    Allergies:  Allergies  Allergen Reactions  . Ibuprofen     Kidneys not able to process     Medications Prior to Admission  Medication Sig Dispense Refill Last Dose  . Prenatal Vit-Fe Fumarate-FA (PREPLUS) 27-1 MG TABS Take 1 tablet by mouth daily.   Taking    Review of Systems  Constitutional: Negative.  Negative for fatigue and fever.  HENT: Negative.   Respiratory: Negative.  Negative for shortness of breath.   Cardiovascular: Negative.  Negative for chest pain.  Gastrointestinal: Negative.  Negative for abdominal pain, constipation, diarrhea, nausea and vomiting.  Genitourinary: Negative.  Negative for dysuria, vaginal bleeding and vaginal discharge.  Neurological: Negative.  Negative for dizziness and headaches.    Physical Exam   Blood pressure 134/85, pulse 93, temperature 98.2 F (36.8 C), temperature source Oral, resp. rate 16, weight 88.5 kg, last menstrual period 06/24/2017.  Physical Exam  Nursing note and vitals reviewed. Constitutional: She is oriented to person, place, and time. She appears well-developed and well-nourished. No distress.  HENT:  Head: Normocephalic.  Eyes: Pupils are equal, round, and reactive to light.  Cardiovascular: Normal rate, regular rhythm and normal heart sounds.  Respiratory: Effort normal and breath sounds normal. No respiratory distress.  GI: Soft. Bowel sounds are normal. She exhibits no distension. There is no tenderness.  Neurological: She is alert and oriented to person, place, and time. She has normal reflexes. No cranial nerve deficit. Coordination normal.  Skin: Skin is warm and dry.  Psychiatric: She has a normal mood and affect. Her behavior is normal. Judgment and thought content normal.   Fetal Tracing:  Baseline: 130 Variability: moderate Accels: 15x15 Decels: none  [place]: occasional uc's  MAU Course  Procedures Results for orders placed or performed during the hospital encounter of 03/14/18 (from the past 24 hour(s))  CBC     Status: Abnormal   Collection Time: 03/14/18  5:03 PM  Result Value Ref Range   WBC 9.2 4.0 - 10.5 K/uL   RBC 3.71 (L) 3.87 - 5.11 MIL/uL   Hemoglobin 11.3 (L) 12.0 - 15.0 g/dL   HCT 33.9 (L) 36.0 - 46.0 %   MCV 91.4 80.0 - 100.0 fL   MCH 30.5 26.0 - 34.0 pg   MCHC 33.3 30.0 - 36.0 g/dL   RDW 13.7 11.5 - 15.5 %   Platelets 207 150 - 400 K/uL   nRBC 0.0 0.0 - 0.2 %  Comprehensive metabolic panel     Status: Abnormal   Collection Time: 03/14/18  5:03 PM  Result Value Ref Range   Sodium 135 135 - 145 mmol/L   Potassium 3.5 3.5 - 5.1 mmol/L   Chloride 106 98 - 111 mmol/L   CO2 20 (L) 22 - 32 mmol/L   Glucose, Bld 80 70 - 99 mg/dL   BUN 11 6 - 20 mg/dL   Creatinine, Ser 0.44 0.44 - 1.00 mg/dL   Calcium  9.3 8.9 - 10.3 mg/dL   Total Protein 7.0 6.5 - 8.1 g/dL   Albumin 3.3 (L) 3.5 - 5.0 g/dL   AST 14 (L) 15 - 41 U/L   ALT 16 0 - 44 U/L   Alkaline Phosphatase 72 38 - 126 U/L   Total Bilirubin 0.7 0.3 - 1.2 mg/dL   GFR calc non Af Amer >60 >60 mL/min   GFR calc Af Amer >60 >60 mL/min   Anion gap 9 5 - 15  Protein / creatinine ratio, urine     Status: Abnormal   Collection Time: 03/14/18  5:20 PM  Result Value Ref Range   Creatinine, Urine 62.00 mg/dL   Total Protein, Urine 13 mg/dL   Protein Creatinine Ratio 0.21 (H) 0.00 - 0.15 mg/mg[Cre]  Urinalysis, Routine w reflex microscopic     Status: Abnormal   Collection Time: 03/14/18  5:20 PM  Result Value Ref Range   Color, Urine YELLOW YELLOW   APPearance HAZY (A) CLEAR   Specific Gravity, Urine 1.012 1.005 - 1.030   pH 7.0 5.0 - 8.0   Glucose, UA NEGATIVE NEGATIVE mg/dL   Hgb urine dipstick MODERATE (A) NEGATIVE   Bilirubin Urine NEGATIVE NEGATIVE   Ketones, ur NEGATIVE NEGATIVE mg/dL   Protein, ur NEGATIVE NEGATIVE mg/dL   Nitrite NEGATIVE NEGATIVE   Leukocytes, UA LARGE (A) NEGATIVE   RBC / HPF 21-50 0 - 5 RBC/hpf   WBC, UA 21-50 0 - 5 WBC/hpf   Bacteria, UA RARE (A) NONE SEEN   Squamous Epithelial / LPF 0-5 0 - 5   Mucus PRESENT    MDM UA, UC CBC, CMP, Protein/creat ratio Bile acids  Assessment and Plan   1. Elevated blood pressure affecting pregnancy, antepartum   2. [redacted] weeks gestation of pregnancy    -Discharge home in stable condition -Preeclampsia precautions discussed -Patient advised to follow-up with [facility] tomorrow for a BP check -Patient may return to MAU as needed or if her condition were to change or worsen   Wende Mott CNM 03/14/2018, 5:27 PM

## 2018-03-14 NOTE — Discharge Instructions (Signed)
Hypertension During Pregnancy °Hypertension, commonly called high blood pressure, is when the force of blood pumping through your arteries is too strong. Arteries are blood vessels that carry blood from the heart throughout the body. Hypertension during pregnancy can cause problems for you and your baby. Your baby may be born early (prematurely) or may not weigh as much as he or she should at birth. Very bad cases of hypertension during pregnancy can be life-threatening. °Different types of hypertension can occur during pregnancy. These include: °· Chronic hypertension. This happens when: °? You have hypertension before pregnancy and it continues during pregnancy. °? You develop hypertension before you are [redacted] weeks pregnant, and it continues during pregnancy. °· Gestational hypertension. This is hypertension that develops after the 20th week of pregnancy. °· Preeclampsia, also called toxemia of pregnancy. This is a very serious type of hypertension that develops only during pregnancy. It affects the whole body, and it can be very dangerous for you and your baby. ° °Gestational hypertension and preeclampsia usually go away within 6 weeks after your baby is born. Women who have hypertension during pregnancy have a greater chance of developing hypertension later in life or during future pregnancies. °What are the causes? °The exact cause of hypertension is not known. °What increases the risk? °There are certain factors that make it more likely for you to develop hypertension during pregnancy. These include: °· Having hypertension during a previous pregnancy or prior to pregnancy. °· Being overweight. °· Being older than age 40. °· Being pregnant for the first time or being pregnant with more than one baby. °· Becoming pregnant using fertilization methods such as IVF (in vitro fertilization). °· Having diabetes, kidney problems, or systemic lupus erythematosus. °· Having a family history of hypertension. ° °What are the  signs or symptoms? °Chronic hypertension and gestational hypertension rarely cause symptoms. Preeclampsia causes symptoms, which may include: °· Increased protein in your urine. Your health care provider will check for this at every visit before you give birth (prenatal visit). °· Severe headaches. °· Sudden weight gain. °· Swelling of the hands, face, legs, and feet. °· Nausea and vomiting. °· Vision problems, such as blurred or double vision. °· Numbness in the face, arms, legs, and feet. °· Dizziness. °· Slurred speech. °· Sensitivity to bright lights. °· Abdominal pain. °· Convulsions. ° °How is this diagnosed? °You may be diagnosed with hypertension during a routine prenatal exam. At each prenatal visit, you may: °· Have a urine test to check for high amounts of protein in your urine. °· Have your blood pressure checked. A blood pressure reading is recorded as two numbers, such as "120 over 80" (or 120/80). The first ("top") number is called the systolic pressure. It is a measure of the pressure in your arteries when your heart beats. The second ("bottom") number is called the diastolic pressure. It is a measure of the pressure in your arteries as your heart relaxes between beats. Blood pressure is measured in a unit called mm Hg. A normal blood pressure reading is: °? Systolic: below 120. °? Diastolic: below 80. ° °The type of hypertension that you are diagnosed with depends on your test results and when your symptoms developed. °· Chronic hypertension is usually diagnosed before 20 weeks of pregnancy. °· Gestational hypertension is usually diagnosed after 20 weeks of pregnancy. °· Hypertension with high amounts of protein in the urine is diagnosed as preeclampsia. °· Blood pressure measurements that stay above 160 systolic, or above 110 diastolic, are   signs of severe preeclampsia.  How is this treated? Treatment for hypertension during pregnancy varies depending on the type of hypertension you have and how  serious it is.  If you take medicines called ACE inhibitors to treat chronic hypertension, you may need to switch medicines. ACE inhibitors should not be taken during pregnancy.  If you have gestational hypertension, you may need to take blood pressure medicine.  If you are at risk for preeclampsia, your health care provider may recommend that you take a low-dose aspirin every day to prevent high blood pressure during your pregnancy.  If you have severe preeclampsia, you may need to be hospitalized so you and your baby can be monitored closely. You may also need to take medicine (magnesium sulfate) to prevent seizures and to lower blood pressure. This medicine may be given as an injection or through an IV tube.  In some cases, if your condition gets worse, you may need to deliver your baby early.  Follow these instructions at home: Eating and drinking  Drink enough fluid to keep your urine clear or pale yellow.  Eat a healthy diet that is low in salt (sodium). Do not add salt to your food. Check food labels to see how much sodium a food or beverage contains. Lifestyle  Do not use any products that contain nicotine or tobacco, such as cigarettes and e-cigarettes. If you need help quitting, ask your health care provider.  Do not use alcohol.  Avoid caffeine.  Avoid stress as much as possible. Rest and get plenty of sleep. General instructions  Take over-the-counter and prescription medicines only as told by your health care provider.  While lying down, lie on your left side. This keeps pressure off your baby.  While sitting or lying down, raise (elevate) your feet. Try putting some pillows under your lower legs.  Exercise regularly. Ask your health care provider what kinds of exercise are best for you.  Keep all prenatal and follow-up visits as told by your health care provider. This is important. Contact a health care provider if:  You have symptoms that your health care  provider told you may require more treatment or monitoring, such as: ? Fever. ? Vomiting. ? Headache. Get help right away if:  You have severe abdominal pain or vomiting that does not get better with treatment.  You suddenly develop swelling in your hands, ankles, or face.  You gain 4 lbs (1.8 kg) or more in 1 week.  You develop vaginal bleeding, or you have blood in your urine.  You do not feel your baby moving as much as usual.  You have blurred or double vision.  You have muscle twitching or sudden tightening (spasms).  You have shortness of breath.  Your lips or fingernails turn blue. This information is not intended to replace advice given to you by your health care provider. Make sure you discuss any questions you have with your health care provider. Document Released: 12/08/2010 Document Revised: 10/10/2015 Document Reviewed: 09/05/2015 Elsevier Interactive Patient Education  2018 [place] Medications in Pregnancy   Acne: Benzoyl Peroxide Salicylic Acid  Backache/Headache: Tylenol: 2 regular strength every 4 hours OR              2 Extra strength every 6 hours  Colds/Coughs/Allergies: Benadryl (alcohol free) 25 mg every 6 hours as needed Breath right strips Claritin Cepacol throat lozenges Chloraseptic throat spray Cold-Eeze- up to three times per day Cough drops, alcohol free Flonase (by prescription  only) Guaifenesin Mucinex Robitussin DM (plain only, alcohol free) Saline nasal spray/drops Sudafed (pseudoephedrine) & Actifed ** use only after [redacted] weeks gestation and if you do not have high blood pressure Tylenol Vicks Vaporub Zinc lozenges Zyrtec   Constipation: Colace Ducolax suppositories Fleet enema Glycerin suppositories Metamucil Milk of magnesia Miralax Senokot Smooth move tea  Diarrhea: Kaopectate Imodium A-D  *NO pepto Bismol  Hemorrhoids: Anusol Anusol HC Preparation  H Tucks  Indigestion: Tums Maalox Mylanta Zantac  Pepcid  Insomnia: Benadryl (alcohol free) 25mg  every 6 hours as needed Tylenol PM Unisom, no Gelcaps  Leg Cramps: Tums MagGel  Nausea/Vomiting:  Bonine Dramamine Emetrol Ginger extract Sea bands Meclizine  Nausea medication to take during pregnancy:  Unisom (doxylamine succinate 25 mg tablets) Take one tablet daily at bedtime. If symptoms are not adequately controlled, the dose can be increased to a maximum recommended dose of two tablets daily (1/2 tablet in the morning, 1/2 tablet mid-afternoon and one at bedtime). Vitamin B6 100mg  tablets. Take one tablet twice a day (up to 200 mg per day).  Skin Rashes: Aveeno products Benadryl cream or 25mg  every 6 hours as needed Calamine Lotion 1% cortisone cream  Yeast infection: Gyne-lotrimin 7 Monistat 7   **If taking multiple medications, please check labels to avoid duplicating the same active ingredients **take medication as directed on the label ** Do not exceed 4000 mg of tylenol in 24 hours **Do not take medications that contain aspirin or ibuprofen

## 2018-03-14 NOTE — MAU Note (Signed)
Pt sent from clinic for elevated BP, has a HA earlier today - is gone now.  Denies visual changes or epigastric pain.  Has braxton hicks uc's, denies bleeding or LOF.  Reports good fetal movement.

## 2018-03-14 NOTE — Progress Notes (Signed)
   PRENATAL VISIT NOTE  Subjective:  Kimberly Colon is a 27 y.o. G2P0010 at [redacted]w[redacted]d being seen today for ongoing prenatal care.  She is currently monitored for the following issues for this low-risk pregnancy and has Polycystic kidney disease; Supervision of high risk pregnancy, antepartum; Hereditary disease in family possibly affecting fetus, fetus 1; Genital warts complicating pregnancy, third trimester; and Transient elevated blood pressure on their problem list.  Patient reports mild HA's, btu none now. Thinks they are r/t new vitamins. Resolved spontaneously. No vision chnages or epigastric pain.  BP's 140/80's on home cuff.  Contractions: Not present. Vag. Bleeding: None.  Movement: Present. Denies leaking of fluid.   The following portions of the patient's history were reviewed and updated as appropriate: allergies, current medications, past family history, past medical history, past social history, past surgical history and problem list. Problem list updated.  Objective:   Vitals:   03/14/18 1551 03/14/18 1558  BP: (!) 152/83 136/83  Pulse: 98   Weight: 195 lb 9.6 oz (88.7 kg)     Fetal Status: Fetal Heart Rate (bpm): 140 Fundal Height: 38 cm Movement: Present     General:  Alert, oriented and cooperative. Patient is in no acute distress.  Skin: Skin is warm and dry. No rash noted.   Cardiovascular: Normal heart rate noted  Respiratory: Normal respiratory effort, no problems with respiration noted  Abdomen: Soft, gravid, appropriate for gestational age.  Pain/Pressure: Present     Pelvic: Cervical exam performed Dilation: Fingertip Effacement (%): Thick Station: Ballotable  Extremities: Normal range of motion.  Edema: Trace  Mental Status: Normal mood and affect. Normal behavior. Normal judgment and thought content.   Assessment and Plan:  Pregnancy: G2P0010 at [redacted]w[redacted]d  1. Hereditary disease in family possibly affecting fetus, fetus 1   2. Polycystic kidney disease   3.  Supervision of high risk pregnancy, antepartum   4. Transient elevated blood pressure - To MAU for Pre-E work-up and NST per consult w/ Dr. Nehemiah Settle.   Return in about 1 week (around 03/21/2018).  Future Appointments  Date Time Provider [place]  03/21/2018  3:15 PM Minette Brine [facility] [place]  03/30/2018  3:15 PM Ardean Larsen, Mervyn Skeeters, CNM [facility] [place], [place]

## 2018-03-15 ENCOUNTER — Ambulatory Visit (INDEPENDENT_AMBULATORY_CARE_PROVIDER_SITE_OTHER): Payer: Medicaid Other

## 2018-03-15 VITALS — BP 132/78 | HR 109 | Wt 196.8 lb

## 2018-03-15 DIAGNOSIS — Z0130 Encounter for examination of blood pressure without abnormal findings: Secondary | ICD-10-CM

## 2018-03-15 LAB — BILE ACIDS, TOTAL: Bile Acids Total: 5.1 umol/L (ref 0.0–10.0)

## 2018-03-15 NOTE — Progress Notes (Signed)
Pt here today for BP check.  Pt reports having a prenatal appt yesterday which she presented with elevated BP and was then walked to MAU where she was recommended to come in today for BP check only.  Pt reports that she was not given any medication at this time.    Notified Dr. Hulan Fray pt BP's.  Provider recommendation to continue to monitor for HTN sx,  if they persist to go to MAU; also keep her prenatal appt scheduled on 03/21/18.  Pt agreed and did not have any other questions.

## 2018-03-15 NOTE — Progress Notes (Signed)
I have reviewed this chart and agree with the RN/CMA assessment and management.    Kyden Potash C Fawn Desrocher, MD, FACOG Attending Physician, [facility]   

## 2018-03-16 LAB — CULTURE, OB URINE: Culture: NO GROWTH

## 2018-03-19 ENCOUNTER — Encounter (HOSPITAL_COMMUNITY): Payer: Self-pay

## 2018-03-19 ENCOUNTER — Inpatient Hospital Stay (HOSPITAL_COMMUNITY)
Admission: AD | Admit: 2018-03-19 | Discharge: 2018-03-22 | DRG: 807 | Disposition: A | Discharge status: Home / Self Care | Payer: 59 | Attending: Obstetrics and Gynecology | Admitting: Obstetrics and Gynecology

## 2018-03-19 DIAGNOSIS — O134 Gestational [pregnancy-induced] hypertension without significant proteinuria, complicating childbirth: Principal | ICD-10-CM | POA: Diagnosis present

## 2018-03-19 DIAGNOSIS — O98313 Other infections with a predominantly sexual mode of transmission complicating pregnancy, third trimester: Secondary | ICD-10-CM

## 2018-03-19 DIAGNOSIS — O4593 Premature separation of placenta, unspecified, third trimester: Secondary | ICD-10-CM | POA: Diagnosis present

## 2018-03-19 DIAGNOSIS — Z3A38 38 weeks gestation of pregnancy: Secondary | ICD-10-CM

## 2018-03-19 DIAGNOSIS — O26893 Other specified pregnancy related conditions, third trimester: Secondary | ICD-10-CM | POA: Diagnosis not present

## 2018-03-19 DIAGNOSIS — O0990 Supervision of high risk pregnancy, unspecified, unspecified trimester: Secondary | ICD-10-CM

## 2018-03-19 DIAGNOSIS — A630 Anogenital (venereal) warts: Secondary | ICD-10-CM

## 2018-03-19 DIAGNOSIS — O139 Gestational [pregnancy-induced] hypertension without significant proteinuria, unspecified trimester: Secondary | ICD-10-CM

## 2018-03-19 NOTE — MAU Note (Signed)
Pt had headache at home so took BP- 150/93. Is being monitored for transient high Bps and was told to come to MAU if she  developed headache or got elevated readings.  Denies vision changes. +FM

## 2018-03-20 ENCOUNTER — Inpatient Hospital Stay (HOSPITAL_COMMUNITY): Payer: 59 | Admitting: Anesthesiology

## 2018-03-20 ENCOUNTER — Encounter (HOSPITAL_COMMUNITY): Payer: Self-pay | Admitting: General Practice

## 2018-03-20 DIAGNOSIS — O26893 Other specified pregnancy related conditions, third trimester: Secondary | ICD-10-CM | POA: Diagnosis present

## 2018-03-20 DIAGNOSIS — O4593 Premature separation of placenta, unspecified, third trimester: Secondary | ICD-10-CM | POA: Diagnosis present

## 2018-03-20 DIAGNOSIS — O134 Gestational [pregnancy-induced] hypertension without significant proteinuria, complicating childbirth: Secondary | ICD-10-CM

## 2018-03-20 DIAGNOSIS — Z3A38 38 weeks gestation of pregnancy: Secondary | ICD-10-CM

## 2018-03-20 LAB — COMPREHENSIVE METABOLIC PANEL
ALT: 17 U/L (ref 0–44)
AST: 16 U/L (ref 15–41)
Albumin: 3.3 g/dL — ABNORMAL LOW (ref 3.5–5.0)
Alkaline Phosphatase: 85 U/L (ref 38–126)
Anion gap: 10 (ref 5–15)
BUN: 13 mg/dL (ref 6–20)
CO2: 19 mmol/L — ABNORMAL LOW (ref 22–32)
Calcium: 9.4 mg/dL (ref 8.9–10.3)
Chloride: 105 mmol/L (ref 98–111)
Creatinine, Ser: 0.42 mg/dL — ABNORMAL LOW (ref 0.44–1.00)
GFR calc Af Amer: >60 mL/min (ref >60)
GFR calc non Af Amer: >60 mL/min (ref >60)
Glucose, Bld: 79 mg/dL (ref 70–99)
Potassium: 3.6 mmol/L (ref 3.5–5.1)
Sodium: 134 mmol/L — ABNORMAL LOW (ref 135–145)
Total Bilirubin: 0.5 mg/dL (ref 0.3–1.2)
Total Protein: 6.5 g/dL (ref 6.5–8.1)

## 2018-03-20 LAB — CBC
HCT: 35.7 % — ABNORMAL LOW (ref 36.0–46.0)
HCT: 37 % (ref 36.0–46.0)
Hemoglobin: 11.7 g/dL — ABNORMAL LOW (ref 12.0–15.0)
Hemoglobin: 12.2 g/dL (ref 12.0–15.0)
MCH: 30.1 pg (ref 26.0–34.0)
MCH: 30.3 pg (ref 26.0–34.0)
MCHC: 32.8 g/dL (ref 30.0–36.0)
MCHC: 33 g/dL (ref 30.0–36.0)
MCV: 91.4 fL (ref 80.0–100.0)
MCV: 92.5 fL (ref 80.0–100.0)
NRBC: 0 % (ref 0.0–0.2)
Platelets: 203 10*3/uL (ref 150–400)
Platelets: 214 10*3/uL (ref 150–400)
RBC: 3.86 MIL/uL — AB (ref 3.87–5.11)
RBC: 4.05 MIL/uL (ref 3.87–5.11)
RDW: 13.7 % (ref 11.5–15.5)
RDW: 13.7 % (ref 11.5–15.5)
WBC: 10 10*3/uL (ref 4.0–10.5)
WBC: 11.8 10*3/uL — ABNORMAL HIGH (ref 4.0–10.5)
nRBC: 0 % (ref 0.0–0.2)

## 2018-03-20 LAB — URINALYSIS, ROUTINE W REFLEX MICROSCOPIC
Bilirubin Urine: NEGATIVE
Glucose, UA: NEGATIVE mg/dL
Ketones, ur: NEGATIVE mg/dL
Nitrite: NEGATIVE
Protein, ur: NEGATIVE mg/dL
pH: 6 (ref 5.0–8.0)

## 2018-03-20 LAB — URINALYSIS, MICROSCOPIC (REFLEX)

## 2018-03-20 LAB — RPR: RPR: NONREACTIVE

## 2018-03-20 LAB — PROTEIN / CREATININE RATIO, URINE
CREATININE, URINE: 14 mg/dL
Total Protein, Urine: <6 mg/dL

## 2018-03-20 LAB — ABO/RH: ABO/RH(D): AB POS

## 2018-03-20 LAB — TYPE AND SCREEN
ABO/RH(D): AB POS
Antibody Screen: NEGATIVE

## 2018-03-20 MED ORDER — LACTATED RINGERS IV SOLN: 500.0000 mL | INTRAVENOUS | Status: DC | PRN

## 2018-03-20 MED ORDER — ACETAMINOPHEN 325 MG PO TABS
650.0000 mg | ORAL_TABLET | Freq: Four times a day (QID) | ORAL | Status: DC
  Administered 2018-03-20 – 2018-03-22 (×7): 650 mg via ORAL
  Filled 2018-03-20 (×7): qty 2

## 2018-03-20 MED ORDER — LIDOCAINE HCL (PF) 1 % IJ SOLN
INTRAMUSCULAR | Status: DC | PRN
  Administered 2018-03-20 (×2): 5 mL via EPIDURAL

## 2018-03-20 MED ORDER — SENNOSIDES-DOCUSATE SODIUM 8.6-50 MG PO TABS
2.0000 | ORAL_TABLET | ORAL | Status: DC
  Administered 2018-03-20 – 2018-03-21 (×2): 2 via ORAL
  Filled 2018-03-20 (×2): qty 2

## 2018-03-20 MED ORDER — FENTANYL CITRATE (PF) 100 MCG/2ML IJ SOLN
100.0000 ug | INTRAMUSCULAR | Status: DC | PRN
  Administered 2018-03-20 (×3): 100 ug via INTRAVENOUS
  Filled 2018-03-20 (×2): qty 2

## 2018-03-20 MED ORDER — OXYTOCIN BOLUS FROM INFUSION
500.0000 mL | Freq: Once | INTRAVENOUS | Status: AC
  Administered 2018-03-20: 500 mL via INTRAVENOUS

## 2018-03-20 MED ORDER — TERBUTALINE SULFATE 1 MG/ML IJ SOLN
0.2500 mg | Freq: Once | INTRAMUSCULAR | Status: DC | PRN
  Filled 2018-03-20: qty 1

## 2018-03-20 MED ORDER — BUTALBITAL-APAP-CAFFEINE 50-325-40 MG PO TABS
2.0000 | ORAL_TABLET | Freq: Once | ORAL | Status: AC
  Administered 2018-03-20: 2 via ORAL
  Filled 2018-03-20: qty 2

## 2018-03-20 MED ORDER — PHENYLEPHRINE 40 MCG/ML (10ML) SYRINGE FOR IV PUSH (FOR BLOOD PRESSURE SUPPORT)
80.0000 ug | PREFILLED_SYRINGE | INTRAVENOUS | Status: DC | PRN
  Filled 2018-03-20 (×2): qty 10

## 2018-03-20 MED ORDER — SIMETHICONE 80 MG PO CHEW: 80.0000 mg | CHEWABLE_TABLET | ORAL | Status: DC | PRN

## 2018-03-20 MED ORDER — LACTATED RINGERS IV SOLN
INTRAVENOUS | Status: DC
  Administered 2018-03-20 (×2): via INTRAVENOUS

## 2018-03-20 MED ORDER — EPHEDRINE 5 MG/ML INJ
10.0000 mg | INTRAVENOUS | Status: DC | PRN
  Filled 2018-03-20: qty 2

## 2018-03-20 MED ORDER — OXYTOCIN 40 UNITS IN LACTATED RINGERS INFUSION - SIMPLE MED: 2.5000 [IU]/h | INTRAVENOUS | Status: DC

## 2018-03-20 MED ORDER — OXYCODONE-ACETAMINOPHEN 5-325 MG PO TABS: 2.0000 | ORAL_TABLET | ORAL | Status: DC | PRN

## 2018-03-20 MED ORDER — MISOPROSTOL 25 MCG QUARTER TABLET
25.0000 ug | ORAL_TABLET | ORAL | Status: DC | PRN
  Administered 2018-03-20 (×2): 25 ug via VAGINAL
  Filled 2018-03-20 (×3): qty 1

## 2018-03-20 MED ORDER — WITCH HAZEL-GLYCERIN EX PADS: 1.0000 "application " | MEDICATED_PAD | CUTANEOUS | Status: DC | PRN

## 2018-03-20 MED ORDER — FENTANYL CITRATE (PF) 100 MCG/2ML IJ SOLN
INTRAMUSCULAR | Status: AC
  Administered 2018-03-20: 100 ug via INTRAVENOUS
  Filled 2018-03-20: qty 2

## 2018-03-20 MED ORDER — OXYCODONE-ACETAMINOPHEN 5-325 MG PO TABS: 1.0000 | ORAL_TABLET | ORAL | Status: DC | PRN

## 2018-03-20 MED ORDER — LACTATED RINGERS IV SOLN: 500.0000 mL | Freq: Once | INTRAVENOUS | Status: DC

## 2018-03-20 MED ORDER — ONDANSETRON HCL 4 MG/2ML IJ SOLN: 4.0000 mg | Freq: Four times a day (QID) | INTRAMUSCULAR | Status: DC | PRN

## 2018-03-20 MED ORDER — ACETAMINOPHEN 325 MG PO TABS: 650.0000 mg | ORAL_TABLET | ORAL | Status: DC | PRN

## 2018-03-20 MED ORDER — DIPHENHYDRAMINE HCL 25 MG PO CAPS: 25.0000 mg | ORAL_CAPSULE | Freq: Four times a day (QID) | ORAL | Status: DC | PRN

## 2018-03-20 MED ORDER — ONDANSETRON HCL 4 MG PO TABS: 4.0000 mg | ORAL_TABLET | ORAL | Status: DC | PRN

## 2018-03-20 MED ORDER — SOD CITRATE-CITRIC ACID 500-334 MG/5ML PO SOLN: 30.0000 mL | ORAL | Status: DC | PRN

## 2018-03-20 MED ORDER — LIDOCAINE HCL (PF) 1 % IJ SOLN
30.0000 mL | INTRAMUSCULAR | Status: DC | PRN
  Filled 2018-03-20: qty 30

## 2018-03-20 MED ORDER — DIBUCAINE 1 % RE OINT: 1.0000 "application " | TOPICAL_OINTMENT | RECTAL | Status: DC | PRN

## 2018-03-20 MED ORDER — TETANUS-DIPHTH-ACELL PERTUSSIS 5-2.5-18.5 LF-MCG/0.5 IM SUSP: 0.5000 mL | Freq: Once | INTRAMUSCULAR | Status: DC

## 2018-03-20 MED ORDER — COCONUT OIL OIL: 1.0000 "application " | TOPICAL_OIL | Status: DC | PRN

## 2018-03-20 MED ORDER — OXYTOCIN 40 UNITS IN LACTATED RINGERS INFUSION - SIMPLE MED
1.0000 m[IU]/min | INTRAVENOUS | Status: DC
  Administered 2018-03-20: 2 m[IU]/min via INTRAVENOUS
  Filled 2018-03-20: qty 1000

## 2018-03-20 MED ORDER — PRENATAL MULTIVITAMIN CH
1.0000 | ORAL_TABLET | Freq: Every day | ORAL | Status: DC
  Administered 2018-03-21 – 2018-03-22 (×2): 1 via ORAL
  Filled 2018-03-20 (×2): qty 1

## 2018-03-20 MED ORDER — BENZOCAINE-MENTHOL 20-0.5 % EX AERO: 1.0000 "application " | INHALATION_SPRAY | CUTANEOUS | Status: DC | PRN

## 2018-03-20 MED ORDER — ACETAMINOPHEN 325 MG PO TABS
650.0000 mg | ORAL_TABLET | ORAL | Status: DC | PRN
  Administered 2018-03-21: 650 mg via ORAL
  Filled 2018-03-20: qty 2

## 2018-03-20 MED ORDER — FENTANYL 2.5 MCG/ML BUPIVACAINE 1/10 % EPIDURAL INFUSION (WH - ANES)
14.0000 mL/h | INTRAMUSCULAR | Status: DC | PRN
  Administered 2018-03-20: 14 mL/h via EPIDURAL
  Filled 2018-03-20: qty 100

## 2018-03-20 MED ORDER — ONDANSETRON HCL 4 MG/2ML IJ SOLN: 4.0000 mg | INTRAMUSCULAR | Status: DC | PRN

## 2018-03-20 MED ORDER — DIPHENHYDRAMINE HCL 50 MG/ML IJ SOLN: 12.5000 mg | INTRAMUSCULAR | Status: DC | PRN

## 2018-03-20 NOTE — Anesthesia Procedure Notes (Signed)
Epidural Patient location during procedure: OB  Staffing Anesthesiologist: Tristian Bouska, MD Performed: anesthesiologist   Preanesthetic Checklist Completed: patient identified, site marked, surgical consent, pre-op evaluation, timeout performed, IV checked, risks and benefits discussed and monitors and equipment checked  Epidural Patient position: sitting Prep: DuraPrep Patient monitoring: heart rate, continuous pulse ox and blood pressure Approach: right paramedian Location: L3-L4 Injection technique: LOR saline  Needle:  Needle type: Tuohy  Needle gauge: 17 G Needle length: 9 cm and 9 Needle insertion depth: 6 cm Catheter type: closed end flexible Catheter size: 20 Guage Catheter at skin depth: 10 cm Test dose: negative  Assessment Events: blood not aspirated, injection not painful, no injection resistance, negative IV test and no paresthesia  Additional Notes Patient identified. Risks/Benefits/Options discussed with patient including but not limited to bleeding, infection, nerve damage, paralysis, failed block, incomplete pain control, headache, blood pressure changes, nausea, vomiting, reactions to medication both or allergic, itching and postpartum back pain. Confirmed with bedside nurse the patient's most recent platelet count. Confirmed with patient that they are not currently taking any anticoagulation, have any bleeding history or any family history of bleeding disorders. Patient expressed understanding and wished to proceed. All questions were answered. Sterile technique was used throughout the entire procedure. Please see nursing notes for vital signs. Test dose was given through epidural needle and negative prior to continuing to dose epidural or start infusion. Warning signs of high block given to the patient including shortness of breath, tingling/numbness in hands, complete motor block, or any concerning symptoms with instructions to call for help. Patient was given  instructions on fall risk and not to get out of bed. All questions and concerns addressed with instructions to call with any issues.     

## 2018-03-20 NOTE — Progress Notes (Signed)
LABOR PROGRESS NOTE  Kimberly Colon is a 27 y.o. G2P0010 at [redacted]w[redacted]d admitted for IOL due to gestational HTN (diagnosed today).  Subjective: She is comfortable, does not desire pain management at this time. Cytotec given at [place].  Objective: BP 140/87   Pulse 95   Temp 98.2 F (36.8 C) (Oral)   Resp 16   Ht 5\' 7"  (1.702 m)   Wt 92.1 kg   LMP 06/24/2017 (Exact Date)   SpO2 100%   BMI 31.79 kg/m  or  Vitals:   03/19/18 2338 03/20/18 0048  BP: (!) 152/86 140/87  Pulse: 93 95  Resp: 18 16  Temp: 97.9 F (36.6 C) 98.2 F (36.8 C)  TempSrc: Oral Oral  SpO2: 100%   Weight: 92.1 kg   Height: 5\' 7"  (1.702 m)     Dilation: Fingertip Effacement (%): Thick Presentation: Vertex Exam by:: K.Kooistra,CNM FHT: Baseline rate 130, moderate variability, 15 x 15 acel, no decel Toco: 20-30s mild contractions  Labs: Lab Results  Component Value Date   WBC 10.0 03/20/2018   HGB 11.7 (L) 03/20/2018   HCT 35.7 (L) 03/20/2018   MCV 92.5 03/20/2018   PLT 214 03/20/2018    Patient Active Problem List   Diagnosis Date Noted  . Transient elevated blood pressure 02/16/2018  . Genital warts complicating pregnancy, third trimester 01/17/2018  . Hereditary disease in family possibly affecting fetus, fetus 7   . Supervision of high risk pregnancy, antepartum 09/21/2017  . Polycystic kidney disease     Assessment / Plan: 27 y.o. G2P0010 at [redacted]w[redacted]d here for IOL due to gestational HTN (diagnosed today).   Labor: IOL, s/p cytotec x 1 Fetal Wellbeing:  Category 1 Pain Control:  Does not want any at this time Anticipated MOD:  SVD  Kimberly Colon MS3 03/20/2018, 1:40 AM

## 2018-03-20 NOTE — Anesthesia Pain Management Evaluation Note (Signed)
  CRNA Pain Management Visit Note  Patient: Kimberly Colon, 27 y.o., female  "Hello I am a member of the anesthesia team at [facility]. We have an anesthesia team available at all times to provide care throughout the hospital, including epidural management and anesthesia for C-section. I don't know your plan for the delivery whether it a natural birth, water birth, IV sedation, nitrous supplementation, doula or epidural, but we want to meet your pain goals."   1.Was your pain managed to your expectations on prior hospitalizations?   No prior hospitalizations  2.What is your expectation for pain management during this hospitalization?     Labor support without medications, Epidural and IV pain meds  3.How can we help you reach that goal? Support PRN  Record the patient's initial score and the patient's pain goal.   Pain: 4  Pain Goal: 9 [facility] wants you to be able to say your pain was always managed very well.  Kathie Rhodes 03/20/2018

## 2018-03-20 NOTE — Lactation Note (Signed)
This note was copied from a baby's chart. Lactation Consultation Note  Patient Name: Kimberly Colon [id number] Date: 03/20/2018 Reason for consult: Initial assessment;1st time breastfeeding;Early term 37-38.6wks P1, 5 hour female infant. Per mom attended BF class in [facility] office at [place] was not receptive of BF suggestions of latching infant to breast. She did not want infant's nose touching her breast after LC explain this is normal behavior and infant can breath her pediatrician was in room and had explain this to her earlier. Infant latched only few seconds mom did not like it she was not in pain but felt he could not breath and that something was wrong that his skin color was red. She is not sure if she could breast feed because she doesn't want her baby to cry. LC tried explain that infant was hungry and explained hunger cues. Mom appears overwhelmed  with her Pediatrician  She is against getting infant vaccinated due to social media research and feels infant cannot breath at breast. Mom will use DEBP every 3 hours for breast stimulation and induction after putting infant to breast. Mom shown how to use DEBP & how to disassemble, clean, & reassemble parts. Mom knows how to hand express. LC discussed I & O. Mom will BF according hunger cues, 8 to 12 times within 24 hours including nights. Mom made aware of O/P services, breastfeeding support groups, community resources, and our phone # for post-discharge questions.  Mom will call LC when she is ready for a consult.   Maternal Data Formula Feeding for Exclusion: No Has patient been taught Hand Expression?: Yes Does the patient have breastfeeding experience prior to this delivery?: No  Feeding Feeding Type: Breast Fed  LATCH Score Latch: Repeated attempts needed to sustain latch, nipple held in mouth throughout feeding, stimulation needed to elicit sucking reflex.  Audible Swallowing: None  Type of Nipple: Everted at rest  and after stimulation  Comfort (Breast/Nipple): Soft / non-tender  Hold (Positioning): Full assist, staff holds infant at breast  LATCH Score: 5  Interventions Interventions: Breast feeding basics reviewed;Assisted with latch;Skin to skin;Breast massage;Support pillows;Adjust position;Breast compression;Reverse pressure;DEBP  Lactation Tools Discussed/Used Tools: Shells Shell Type: Other (comment)(short shafted ) WIC Program: Yes Pump Review: Setup, frequency, and cleaning Initiated by:: Breast stimulation and induction Date initiated:: 03/20/18   Consult Status Consult Status: Follow-up Date: 03/20/18 Follow-up type: In-patient    Vicente Serene 03/20/2018, 9:48 PM

## 2018-03-20 NOTE — Progress Notes (Addendum)
LABOR PROGRESS NOTE  Kimberly Colon is a 27 y.o. G2P0010 at [redacted]w[redacted]d  admitted for IOL due to [facility].   Subjective:  Doing well, pt reports mild cramping  Objective: BP 138/83   Pulse 100   Temp 98.2 F (36.8 C) (Oral)   Resp 15   Ht 5\' 7"  (1.702 m)   Wt 92.1 kg   LMP 06/24/2017 (Exact Date)   SpO2 100%   BMI 31.79 kg/m  or  Vitals:   03/20/18 0048 03/20/18 0210 03/20/18 0327 03/20/18 0500  BP: 140/87 (!) 147/87 133/80 138/83  Pulse: 95 89 91 100  Resp: 16 15 16 15   Temp: 98.2 F (36.8 C)     TempSrc: Oral     SpO2:      Weight:      Height:        0530- Foley bulb placed  Dilation: 1.5 Effacement (%): 40 Cervical Position: Posterior Station: -1 Presentation: Vertex Exam by:: Wende Bushy CNM  FHT: baseline rate 125, moderate varibility, positive acel, no decel Toco: every 2-4 minutes, lasting 60 seconds   Labs: Lab Results  Component Value Date   WBC 10.0 03/20/2018   HGB 11.7 (L) 03/20/2018   HCT 35.7 (L) 03/20/2018   MCV 92.5 03/20/2018   PLT 214 03/20/2018    Patient Active Problem List   Diagnosis Date Noted  . Transient elevated blood pressure 02/16/2018  . Genital warts complicating pregnancy, third trimester 01/17/2018  . Hereditary disease in family possibly affecting fetus, fetus 26   . Supervision of high risk pregnancy, antepartum 09/21/2017  . Polycystic kidney disease     Assessment / Plan: 28 y.o. G2P0010 at [redacted]w[redacted]d here for IOL for GHTN   Labor:  cervical foley placed by CNM and 2nd cytotec dose  Fetal Wellbeing:  Category 1 Pain Control:  Fentanyl IV Anticipated MOD:  NSVD  S Deloglos SNM  03/20/2018, 5:45 AM   Assessment, foley bulb and cytotec placed by me  Pitocin when FB comes out   Lajean Manes, CNM 03/20/18, 6:32 AM

## 2018-03-20 NOTE — Progress Notes (Signed)
MD notified about Pt elevated high blood pressure.  No new orders to treat since pt is complete and pushing with contractions.  Will continue to monitor.  Krystal [place] RN

## 2018-03-20 NOTE — Progress Notes (Signed)
LABOR PROGRESS NOTE  Kimberly Colon is a 27 y.o. G2P0010 at [redacted]w[redacted]d  admitted for IOL due to [facility].   Subjective: Patient nervous about upcoming pain with labor and delivery. Provided reassurance and counseling at bedside.   Objective: BP (!) 142/82 (BP Location: Left Arm)   Pulse (!) 110   Temp 98.2 F (36.8 C) (Oral)   Resp 18   Ht 5\' 7"  (1.702 m)   Wt 92.1 kg   LMP 06/24/2017 (Exact Date)   SpO2 98%   BMI 31.79 kg/m  or  Vitals:   03/20/18 0500 03/20/18 0610 03/20/18 0656 03/20/18 0729  BP: 138/83 140/87 (!) 142/86 (!) 142/82  Pulse: 100 (!) 110 (!) 109 (!) 110  Resp: 15 16 15 18   Temp:    98.2 F (36.8 C)  TempSrc:    Oral  SpO2:    98%  Weight:      Height:        Dilation: 1.5 Effacement (%): 40 Cervical Position: Posterior Station: -1 Presentation: Vertex Exam by:: Wende Bushy CNM  FHT: baseline rate 130, moderate varibility, positive acel, no decel Toco: q1-3 min   Labs: Lab Results  Component Value Date   WBC 10.0 03/20/2018   HGB 11.7 (L) 03/20/2018   HCT 35.7 (L) 03/20/2018   MCV 92.5 03/20/2018   PLT 214 03/20/2018    Patient Active Problem List   Diagnosis Date Noted  . Transient elevated blood pressure 02/16/2018  . Genital warts complicating pregnancy, third trimester 01/17/2018  . Hereditary disease in family possibly affecting fetus, fetus 22   . Supervision of high risk pregnancy, antepartum 09/21/2017  . Polycystic kidney disease     Assessment / Plan: 27 y.o. G2P0010 at [redacted]w[redacted]d here for IOL for GHTN   GHTN: No severe range pressures. PEC labs WNL. Asymptomatic.  Labor:  FB remains in place since 0530. Will not give 3rd cytotec due to contraction pattern.  Fetal Wellbeing:  Category 1 Pain Control:  Fentanyl IV, Epidural when FB out  Anticipated MOD:  NSVD  Phill Myron, D.O. OB Family Medicine Fellow, [facility] for [facility], [place] [facility] 03/20/2018, 10:05 AM

## 2018-03-20 NOTE — Anesthesia Preprocedure Evaluation (Signed)

## 2018-03-20 NOTE — H&P (Signed)
Kimberly Colon is a 27 y.o. female G2P0010 with IUP at [redacted]w[redacted]d presenting for evaluation for headache. . Pt states she has been having none contractions, associated with scant staining vaginal bleeding for no  hours..  Membranes are intact, with active fetal movement.   [facility] at [facility]  since 12 weeks 5 days.   Prenatal History/Complications:  Past Medical History: Past Medical History:  Diagnosis Date  . Polycystic kidney disease   . Renal stone     Past Surgical History: Past Surgical History:  Procedure Laterality Date  . NO PAST SURGERIES      Obstetrical History: OB History    Gravida  2   Para      Term      Preterm      AB  1   Living        SAB      TAB  1   Ectopic      Multiple      Live Births           Obstetric Comments  G1: 2016 EAB, pills         Social History: Social History   Socioeconomic History  . Marital status: Single    Spouse name: Not on file  . Number of children: Not on file  . Years of education: Not on file  . Highest education level: Not on file  Occupational History  . Not on file  Social Needs  . Financial resource strain: Not on file  . Food insecurity:    Worry: Not on file    Inability: Not on file  . Transportation needs:    Medical: Not on file    Non-medical: Not on file  Tobacco Use  . Smoking status: Never Smoker  . Smokeless tobacco: Never Used  Substance and Sexual Activity  . Alcohol use: Not Currently  . Drug use: Not Currently  . Sexual activity: Not Currently    Birth control/protection: None  Lifestyle  . Physical activity:    Days per week: Not on file    Minutes per session: Not on file  . Stress: Not on file  Relationships  . Social connections:    Talks on phone: Not on file    Gets together: Not on file    Attends religious service: Not on file    Active member of club or organization: Not on file    Attends meetings of clubs or organizations: Not on file    Relationship  status: Not on file  Other Topics Concern  . Not on file  Social History Narrative  . Not on file    Family History: No family history on file.  Allergies: Allergies  Allergen Reactions  . Ibuprofen     Kidneys not able to process     Medications Prior to Admission  Medication Sig Dispense Refill Last Dose  . Prenatal Vit-Fe Fumarate-FA (PREPLUS) 27-1 MG TABS Take 1 tablet by mouth daily.   03/19/2018 at Unknown time        Review of Systems   Constitutional: Negative for fever and chills Eyes: Negative for visual disturbances Respiratory: Negative for shortness of breath, dyspnea Cardiovascular: Negative for chest pain or palpitations  Gastrointestinal: Negative for vomiting, diarrhea and constipation.  POSITIVE for abdominal pain (contractions) Genitourinary: Negative for dysuria and urgency Musculoskeletal: Negative for back pain, joint pain, myalgias  Neurological: Negative for dizziness and headaches      Blood pressure (!) 152/86, pulse 93,  temperature 97.9 F (36.6 C), temperature source Oral, resp. rate 18, height 5\' 7"  (1.702 m), weight 92.1 kg, last menstrual period 06/24/2017, SpO2 100 %. General appearance: alert, cooperative and no distress Lungs: clear to auscultation bilaterally Heart: regular rate and rhythm Abdomen: soft, non-tender; bowel sounds normal Extremities: Homans sign is negative, no sign of DVT DTR's 2+ Presentation: cephalic Fetal monitoring  Baseline: 130 bpm Uterine activity  None     Prenatal labs: ABO, Rh: --/--/AB POS (07/30 1041) Antibody: Negative (06/19 1547) Rubella: 2.19 (06/19 1547) RPR: Non Reactive (10/01 0833)  HBsAg: Negative (06/19 1547)  HIV: Non Reactive (10/01 0833)  GBS:   Negative 1 hr Glucola 77 114 93 Genetic screening  Anatomy US normal  Prenatal Transfer Tool  Maternal Diabetes: No Genetic Screening: Normal Maternal Ultrasounds/Referrals: Normal Fetal Ultrasounds or other Referrals:  Other:  normal; Maternal Substance Abuse:  No Significant Maternal Medications:  None Significant Maternal Lab Results: None     No results found for this or any previous visit (from the past 24 hour(s)).  Assessment: Kimberly Colon is a 27 y.o. G2P0010 with an IUP at [redacted]w[redacted]d presenting for evaluation for elevated BP. Patient's BP is elevated today in triage, and patient has 4 elevated blood pressures earlier this week. She meets criteria for gestational hypertension and will be induced.  Pre-e labs are pending; diagnosis may changed to pre-eclampsia depending on lab work or headache. Fioricet two tabs have been ordered.   Patient's chart has a note that patient is chronic hypertensive; however, can only find one elevated BP prior to pregnancy. Patient has not been in antenatal testing.   Plan: #Labor: expectant management #Pain:  Per request #FWB Cat 1 #ID: GBS:  neg #MOF:  breast #MOC: unsure #Circ: yes, inpatient if FOB agrees to pay.    Kimberly Colon 03/20/2018, 12:01 AM

## 2018-03-21 ENCOUNTER — Encounter: Payer: 59 | Admitting: Advanced Practice Midwife

## 2018-03-21 LAB — CBC
HCT: 30.8 % — ABNORMAL LOW (ref 36.0–46.0)
Hemoglobin: 10.2 g/dL — ABNORMAL LOW (ref 12.0–15.0)
MCH: 30.3 pg (ref 26.0–34.0)
MCHC: 33.1 g/dL (ref 30.0–36.0)
MCV: 91.4 fL (ref 80.0–100.0)
Platelets: 185 10*3/uL (ref 150–400)
RBC: 3.37 MIL/uL — ABNORMAL LOW (ref 3.87–5.11)
RDW: 13.9 % (ref 11.5–15.5)
WBC: 12.2 10*3/uL — ABNORMAL HIGH (ref 4.0–10.5)
nRBC: 0 % (ref 0.0–0.2)

## 2018-03-21 NOTE — Anesthesia Postprocedure Evaluation (Signed)
Anesthesia Post Note  Patient: Kimberly Colon  Procedure(s) Performed: AN AD HOC LABOR EPIDURAL     Patient location during evaluation: Mother Baby Anesthesia Type: Epidural Level of consciousness: awake and alert Pain management: pain level controlled Vital Signs Assessment: post-procedure vital signs reviewed and stable Respiratory status: spontaneous breathing, nonlabored ventilation and respiratory function stable Cardiovascular status: stable Postop Assessment: no headache, no backache and epidural receding Anesthetic complications: no    Last Vitals:  Vitals:   03/21/18 1353 03/21/18 1354  BP: 138/82 136/71  Pulse: 93 (!) 101  Resp: 17   Temp: 36.6 C   SpO2: 100% 98%    Last Pain:  Vitals:   03/21/18 2003  TempSrc:   PainSc: 5    Pain Goal: Patients Stated Pain Goal: 3 (03/20/18 0720)               Gilmer Mor

## 2018-03-21 NOTE — Addendum Note (Signed)
Addendum  created 03/21/18 2015 by Alvy Bimler, CRNA   Clinical Note Signed

## 2018-03-21 NOTE — Anesthesia Postprocedure Evaluation (Signed)
Anesthesia Post Note  Patient: Kimberly Colon  Procedure(s) Performed: AN AD HOC LABOR EPIDURAL     Patient location during evaluation: Mother Baby Anesthesia Type: Epidural Level of consciousness: awake and alert Pain management: pain level controlled Vital Signs Assessment: post-procedure vital signs reviewed and stable Respiratory status: spontaneous breathing, nonlabored ventilation and respiratory function stable Cardiovascular status: stable Postop Assessment: no headache, no backache and epidural receding Anesthetic complications: no    Last Vitals:  Vitals:   03/21/18 1353 03/21/18 1354  BP: 138/82 136/71  Pulse: 93 (!) 101  Resp: 17   Temp: 36.6 C   SpO2: 100% 98%    Last Pain:  Vitals:   03/21/18 1510  TempSrc:   PainSc: 0-No pain                 Montez Hageman

## 2018-03-21 NOTE — Progress Notes (Signed)
POSTPARTUM PROGRESS NOTE  Post Partum Day 1  Subjective:  Kimberly Colon is a 27 y.o. G2P1011 s/p SVD at [redacted]w[redacted]d.  She reports she is doing well. No acute events overnight. She denies any problems with ambulating, voiding or po intake. Denies nausea or vomiting.  Pain is well controlled.  Lochia is improving, has used 3 pads since yesterday. Dx with gHTN yesterday. Pt denies headache, vision changes, RUQ tenderness.   Objective: Blood pressure 107/71, pulse 85, temperature 98 F (36.7 C), temperature source Oral, resp. rate 18, height 5\' 7"  (1.702 m), weight 92.1 kg, last menstrual period 06/24/2017, SpO2 100 %, unknown if currently breastfeeding.  Physical Exam:  General: alert, cooperative and no distress Chest: no respiratory distress Heart:regular rate, distal pulses intact Abdomen: soft, nontender,  Uterine Fundus: firm, appropriately tender DVT Evaluation: No calf swelling or tenderness Extremities: No edema Skin: warm, dry  Recent Labs    03/20/18 1447 03/21/18 0527  HGB 12.2 10.2*  HCT 37.0 30.8*    Assessment/Plan: Kimberly Colon is a 27 y.o. G2P1011 s/p SVD at [redacted]w[redacted]d   PPD#1 - Doing well - Routine postpartum care - H& H stable - gHTN: No severe range pressures. BP this morning 107/71. Pt asymptomatic. PEC labs negative.   Contraception: Abstinence  Feeding: Breast Circumcision: Inpatient.  Dispo: Plan for discharge tomorrow.   LOS: 1 day   Taraoluwa Thakur, PA-S. 03/21/2018, 7:38 AM

## 2018-03-21 NOTE — Lactation Note (Signed)
This note was copied from a baby's chart. Lactation Consultation Note  Patient Name: Kimberly Colon [id number] Date: 03/21/2018 Reason for consult: Follow-up assessment;Term LC entered room infant cuing to breastfeed. LC ask mom to due nipple stimulation prior to latching infant to breast.  Mom latched infant to left breast using cross cradle hold, LC ask mom wait until infant mouth is wide with tongue down. Infant breastfeed for 10 minutes and was still breastfeeding as [name] left room.  [place] reminded mom nose touching breast, LC notice infant swallowing at breast.  Mom plans to use DEBP after feeding infant at breast for stimulation and induction of breast milk.  Mom knows to call Nurse or LC if she needs further assistance with breastfeeding.  Maternal Data    Feeding    LATCH Score Latch: Grasps breast easily, tongue down, lips flanged, rhythmical sucking.  Audible Swallowing: Spontaneous and intermittent  Type of Nipple: Everted at rest and after stimulation  Comfort (Breast/Nipple): Soft / non-tender(short shafted )  Hold (Positioning): Assistance needed to correctly position infant at breast and maintain latch.  LATCH Score: 9  Interventions Interventions: Assisted with latch;Breast compression;Adjust position;Position options;Support pillows  Lactation Tools Discussed/Used     Consult Status      Kimberly Colon 03/21/2018, 1:51 AM

## 2018-03-21 NOTE — Lactation Note (Signed)
This note was copied from a baby's chart. Lactation Consultation Note  Patient Name: Boy Nyeemah Jennette [id number] Date: 03/21/2018 Reason for consult: Follow-up assessment;Early term 37-38.6wks;Primapara;1st time breastfeeding  P1 mother whose infant is now 38 hours old.    Baby was swaddled and sleeping in bassinet when I arrived.  Since it had been 4 hours since he last fed I offered to assist with latching and mother accepted.  Removed his clothing and asked mother to feed him STS.  Demonstrated techniques on how to awaken a sleepy baby.  Assisted baby to latch on the left breast.  However, once latched he did not suck.  Gentle stimulation did not arouse him enough to begin sucking.  Removed baby from the breast and burped him to try to arouse him.  Attempted to latch again on the left breast without success.  Suggested mother continue STS and watch for feeding cues.  She will also continue hand expression and pumping with the DEBP that has been initiated at bedside. Mother knows to feed back any EBM she obtains to baby.  Offered to return for latch assistance the next time baby shows an interest in feeding.  Mother will plan to call for assistance.     Maternal Data Formula Feeding for Exclusion: No Has patient been taught Hand Expression?: Yes Does the patient have breastfeeding experience prior to this delivery?: No  Feeding    LATCH Score                   Interventions    Lactation Tools Discussed/Used     Consult Status      Valerye Kobus R Rosalynn Sergent 03/21/2018, 5:14 PM

## 2018-03-21 NOTE — Plan of Care (Signed)
Progressing appropriately. Encouraged to call for assistance as needed, and for LATCH assessment.  

## 2018-03-22 ENCOUNTER — Ambulatory Visit: Payer: Self-pay

## 2018-03-22 ENCOUNTER — Other Ambulatory Visit: Payer: Self-pay

## 2018-03-22 MED ORDER — ACETAMINOPHEN 325 MG PO TABS: 650.0000 mg | ORAL_TABLET | ORAL | 0 refills | Status: AC | PRN

## 2018-03-22 NOTE — Lactation Note (Signed)
This note was copied from a baby's chart. Lactation Consultation Note:  Infant is 64 hours old and 38.3 weeks. Infant is at 6% weight loss.  Mother reports that infant latches on and suckles a few times and then falls asleep.  Chart review revels that infant has had one feeding since life. All other feeding are described by mother As a few short sucks.  Attempt to latch infant . Mother has large amts of colostrum. Infant refuses breast.   Assist mother with hand expression. Infant was fed 8 ml of ebm with a spoon. Lots of assistance needed for mother to be able to hand express.  Multiple attempts to latch infant. Infant very sleepy and appeared to need calories.   Infant was given 10 ml of formula with curved tip syringe and gloved finger.  Infant placed to breast again in cross cradle hold. Infant suckled for 5 mins on and off.  Lots of teaching with mother. Mother advised to continue to feed infant with ebm and formula  With each feeding. Mother to feed infant 8-12 times in 24 hours or more.  Discussed need for supplement since infant is not latching. Discussed supplemental amts and size of infants tummy.  MGM at the bedside and is very supportive.  Advised mother to post pump for 15-20 mins.   Mother has a harmony hand pump for home use. Mother advised to post pump for 15 mins on each breast with hand pump when at home at least 6-8 times in 24 hours.  Discussed treatment and prevention of engorgement.   Suggested that mother remain in hospital until infant has a good feeding.  Mother advised to page for staff nurse or [facility] with next feeding.  Mother made aware of available [place] services and community support.   Patient Name: Kimberly Colon [id number] Date: 03/22/2018 Reason for consult: Follow-up assessment   Maternal Data    Feeding Feeding Type: Breast Fed  LATCH Score Latch: Repeated attempts needed to sustain latch, nipple held in mouth throughout feeding, stimulation  needed to elicit sucking reflex.  Audible Swallowing: A few with stimulation  Type of Nipple: Everted at rest and after stimulation  Comfort (Breast/Nipple): Filling, red/small blisters or bruises, mild/mod discomfort  Hold (Positioning): Assistance needed to correctly position infant at breast and maintain latch.  LATCH Score: 6  Interventions Interventions: Assisted with latch;Skin to skin;Hand express;Breast compression;Adjust position;Support pillows;Position options;Expressed milk;Hand pump;DEBP  Lactation Tools Discussed/Used     Consult Status Consult Status: Follow-up Date: 03/23/18 Follow-up type: In-patient    [name] [facility] 03/22/2018, 12:14 PM

## 2018-03-22 NOTE — Discharge Summary (Signed)
OB Discharge Summary     Patient Name: Kimberly Colon DOB: 1990/06/16 MRN: [id number]  Date of admission: 03/19/2018 Delivering MD: Nicolette Bang   Date of discharge: 03/22/2018  Admitting diagnosis: 38WKS HBP Intrauterine pregnancy: [redacted]w[redacted]d     Secondary diagnosis:  Active Problems:   * No active hospital problems. *  Additional problems: gHTN dx on 12/16, inpatient circ     Discharge diagnosis: Term Pregnancy Delivered                                                                                                Post partum procedures:none  Augmentation: Pitocin, Cytotec and Foley Balloon  Complications: Placental Abruption  Hospital course:  Induction of Labor With Vaginal Delivery   27 y.o. yo G2P1011 at [redacted]w[redacted]d was admitted to the hospital 03/19/2018 for induction of labor.  Indication for induction: Gestational hypertension.  Patient had an uncomplicated labor course as follows: Membrane Rupture Time/Date: 1:12 PM ,03/20/2018   Intrapartum Procedures: Episiotomy: None [1]                                         Lacerations:  Labial [10]  Patient had delivery of a Viable infant.  Information for the patient's newborn:  Dylin, Ihnen [[id number]]      03/20/2018  Details of delivery can be found in separate delivery note.  Patient had a routine postpartum course. Patient is discharged home 03/22/18.  Physical exam  Vitals:   03/21/18 1353 03/21/18 1354 03/21/18 2310 03/22/18 0634  BP: 138/82 136/71 120/90 123/71  Pulse: 93 (!) 101 97 93  Resp: 17  19 18   Temp: 97.8 F (36.6 C)  98.5 F (36.9 C) 97.8 F (36.6 C)  TempSrc: Oral  Oral Oral  SpO2: 100% 98%    Weight:      Height:       General: alert, cooperative and no distress Lochia: appropriate Uterine Fundus: firm Incision: N/A DVT Evaluation: No evidence of DVT seen on physical exam. Labs: Lab Results  Component Value Date   WBC 12.2 (H) 03/21/2018   HGB 10.2 (L) 03/21/2018   HCT  30.8 (L) 03/21/2018   MCV 91.4 03/21/2018   PLT 185 03/21/2018   CMP Latest Ref Rng & Units 03/20/2018  Glucose 70 - 99 mg/dL 79  BUN 6 - 20 mg/dL 13  Creatinine 0.44 - 1.00 mg/dL 0.42(L)  Sodium 135 - 145 mmol/L 134(L)  Potassium 3.5 - 5.1 mmol/L 3.6  Chloride 98 - 111 mmol/L 105  CO2 22 - 32 mmol/L 19(L)  Calcium 8.9 - 10.3 mg/dL 9.4  Total Protein 6.5 - 8.1 g/dL 6.5  Total Bilirubin 0.3 - 1.2 mg/dL 0.5  Alkaline Phos 38 - 126 U/L 85  AST 15 - 41 U/L 16  ALT 0 - 44 U/L 17    Discharge instruction: per After Visit Summary and "Baby and Me Booklet".  After visit meds:  Allergies as of 03/22/2018      Reactions  Ibuprofen    Kidneys not able to process       Medication List    TAKE these medications   acetaminophen 325 MG tablet Commonly known as:  TYLENOL Take 2 tablets (650 mg total) by mouth every 4 (four) hours as needed (for pain scale < 4).   PREPLUS 27-1 MG Tabs Take 1 tablet by mouth daily.       Diet: routine diet  Activity: Advance as tolerated. Pelvic rest for 6 weeks.   Outpatient follow up:4 weeks Follow up Appt: Future Appointments  Date Time Provider [place]  05/01/2018  4:20 PM Rasch, Artist Pais, NP [facility]   Follow up Visit:No follow-ups on file.  Postpartum contraception: Undecided  Newborn Data: Live born female  Birth Weight: 6 lb 2.9 oz (2805 g) APGAR: 8, 8  Newborn Delivery   Birth date/time:  03/20/2018 16:04:00 Delivery type:  Vaginal, Spontaneous     Baby Feeding: Breast and with having difficulty latching and with milk supply. lactation specialist has been following inpatient. mom is pumping and direct breast feeding Disposition:home with mother   03/22/2018 Richarda Osmond, DO   I spoke with and examined patient and agree with resident/PA-S/MS/SNM's note and plan of care.  VSS, BP well controlled on no meds. Reviewed pre-e s/s, reasons to seek care. [facility] visit on Friday for bp check Inpatient  circ  Roma Schanz, CNM, [facility] 03/22/2018 7:58 AM

## 2018-03-22 NOTE — Discharge Instructions (Signed)

## 2018-03-22 NOTE — Lactation Note (Addendum)
This note was copied from a baby's chart. Lactation Consultation Note  Patient Name: Kimberly Colon Date: 03/22/2018    Arrived and mom reported infant has not woken to feed so she pumped.  Mom has approximately 10 ml of EBM pumped. It has been almost three hours since last feeding.  Urged mom to wake him at least every three hours from beginning of feed and not the end of feed if he does not wake to feed/ latch.  Room full of visitors.  Mom distracted with visitors. Very hard to work with him and mom with all the visitors watching and trying to assist. Attempt to latch infant.  He will open but just hold nipple in mouth.  After a few attempts got mom to finger feed what she had pumped to infant via curved tip syringe.  Mom asked about going home feeding him and if she should start bottles.  Discussed as he takes larger amounts it would be easier for mom to offer bottle . Infant started showing interest in feeding.  Assist again with positioning and latch.  Infant opens and will suck a few times now but not able to maintain. Mom with somewhat  large nipples , However use nipple shield on right breast in football and infant latched and breastfed however needed continued stimulation to feed.  He feel asleep after approximately 5 minutes at breast and could not get him awake. Attempt to burp him and over other breast with nipple shield in cross cradle hold.  He breastfed for about 2 minutes on the left breast but then fell asleep and could not get him to wake back up.  Mom with about 2 more ml of EBM which LC offered with finger and curved tip syringe and infant took it and fell asleep.  Urged mom to continue to offer the breast based on cues and every 3 hours.  Pump past breastfeeds and feed back all EBM and formula as recommended. Mom has some cracking on left nipple.  Urged her to hand express rub EBM on it and air dry then if needed something else she could use coconut oil.  Mom reports she was  told not to use coconut oil on her nipples.  That she uses a lot of it on her hair and skin.  Explained to mom that there is difference between food grade coconut oil and the coconut oil you may purchase for your skin/hair.  Mom does use the food grade coconut oil.  Explained to her that was okay to use but always use her milk first. Left name and number white board and encouraged mom to call [facility] or RN when he starts cuing or in three hours from beginning of feeding. Maternal Data    Feeding    LATCH Score                   Interventions    Lactation Tools Discussed/Used     Consult Status      Kimberly Colon 03/22/2018, 7:38 PM

## 2018-03-22 NOTE — Plan of Care (Signed)
Progressing appropriately. Encouraged to call for assistance as needed, and for LATCH assessment.  

## 2018-03-23 ENCOUNTER — Ambulatory Visit: Payer: Self-pay

## 2018-03-23 NOTE — Lactation Note (Signed)
This note was copied from a baby's chart. Lactation Consultation Note  Patient Name: Kimberly Colon [id number] Date: 03/23/2018 Reason for consult: Follow-up assessment;Primapara;1st time breastfeeding;Difficult latch;Early term 37-38.6wks;Infant weight loss  Visited with P66 Mom of ET infant on day of discharge, baby 29 hrs old, and at 7% weight loss.   Mom has large nipples and baby <6 lbs, and having difficult time latching deeply.  Mom is pumping and supplementing baby, now exclusively with EBM as her last pumping was for 50 ml.   Mom has [place] in [place].  WIC to provide her with a DEBP today. Mom discussed OP lactation support available.  Pediatrician office has IBCLC, and will follow-up with her.  Mom aware of OP lactation support at [facility] also.    Plan- 1- Keep baby STS as much as possible 2- Offer breast at least every 3 hrs, awakening baby well for feedings. 3- supplement with EBM per volume guidelines, 50-60 ml currently.  4- Pump both breasts 15-20 mins (WIC to provide a DEBP today) 5- Call prn for concerns, follow-up with lactation as outpatient.  Engorgement prevention and treatment discussed. Interventions: Breast feeding basics reviewed;Skin to skin;Breast massage;Hand express;DEBP;Expressed milk  Lactation Tools Discussed/Used WIC Program: Yes   Consult Status Consult Status: Complete Date: 03/23/18 Follow-up type: Call as needed    Kimberly Colon 03/23/2018, 10:44 AM

## 2018-03-30 ENCOUNTER — Encounter: Payer: 59 | Admitting: Student

## 2018-05-01 ENCOUNTER — Ambulatory Visit: Payer: 59 | Admitting: Obstetrics and Gynecology

## 2018-05-10 ENCOUNTER — Ambulatory Visit (INDEPENDENT_AMBULATORY_CARE_PROVIDER_SITE_OTHER): Payer: 59 | Admitting: Nurse Practitioner

## 2018-05-10 ENCOUNTER — Encounter: Payer: Self-pay | Admitting: Nurse Practitioner

## 2018-05-10 DIAGNOSIS — Z1389 Encounter for screening for other disorder: Secondary | ICD-10-CM

## 2018-05-10 DIAGNOSIS — Q613 Polycystic kidney, unspecified: Secondary | ICD-10-CM

## 2018-05-10 DIAGNOSIS — O139 Gestational [pregnancy-induced] hypertension without significant proteinuria, unspecified trimester: Secondary | ICD-10-CM | POA: Insufficient documentation

## 2018-05-10 NOTE — Progress Notes (Signed)
Pt does not want any  BC, practicing abstinence.

## 2018-05-10 NOTE — Progress Notes (Signed)
Subjective:     Kimberly Colon is a 28 y.o. female who presents for a postpartum visit. She is 7 weeks postpartum following a spontaneous vaginal delivery. I have fully reviewed the prenatal and intrapartum course. The delivery was at 38/3 gestational weeks. Outcome: spontaneous vaginal delivery. Anesthesia: epidural. Postpartum course has been unremarkable. Baby's course has been uneventful. Baby is feeding by breast. Bleeding no bleeding. Bowel function is normal. Bladder function is normal. Patient is not sexually active. Contraception method is abstinence. Postpartum depression screening: negative.  The following portions of the patient's history were reviewed and updated as appropriate: allergies, current medications, past family history, past medical history, past social history, past surgical history and problem list.  Review of Systems Pertinent items noted in HPI and remainder of comprehensive ROS otherwise negative.   Objective:    BP 136/89   Pulse 90   Ht 5\' 7"  (1.702 m)   Wt 167 lb 11.2 oz (76.1 kg)   Breastfeeding Yes   BMI 26.27 kg/m   General:  alert, cooperative and no distress   Breasts:  not examined  Lungs: clear to auscultation bilaterally  Heart:  regular rate and rhythm, S1, S2 normal, no murmur, click, rub or gallop  Abdomen: soft with 4 fingerbreaths of diastasis noted and reviewed with client.   Vulva:  not evaluated  Vagina: not evaluated  Cervix:  not evaluated  Corpus: not examined  Adnexa:  not evaluated  Rectal Exam: Not performed.        Assessment:    normal postpartum exam. Pap smear not done at today's visit.   Plan:    1. Contraception: abstinence.  Return for contraception if becomes sexually active 2. See your primary care MD ASAP in the next 2 weeks - you may need medication for your blood pressure given that you have polycystic kidney disease and you had gestational hypertension. 3. Follow up in: 12 months or as needed.    Earlie Server, RN, MSN, NP-BC Nurse Practitioner, [facility] for [employer] [employer] 05/10/2018 7:48 PM

## 2019-05-11 IMAGING — US US MFM OB DETAIL+14 WK
1 series · 13 of 28 positions shown · non-contrast
Comparison: none

[Series 1: us mfm ob detail+14 wk · 89 acquisitions, 13 frames shown]
[im 4/89]
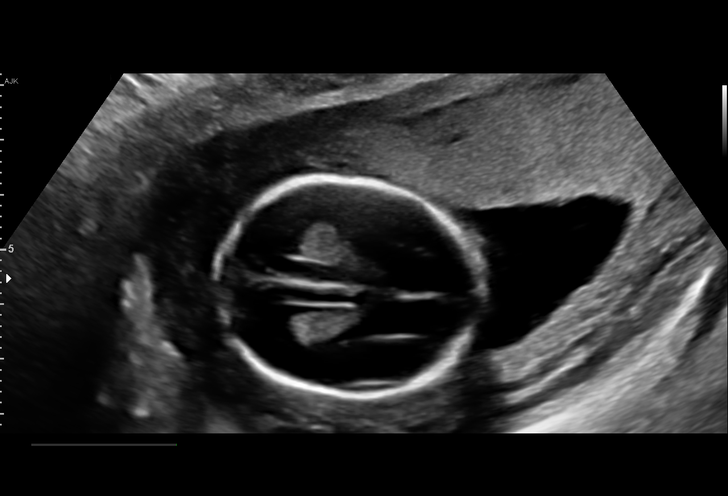
[im 10/89]
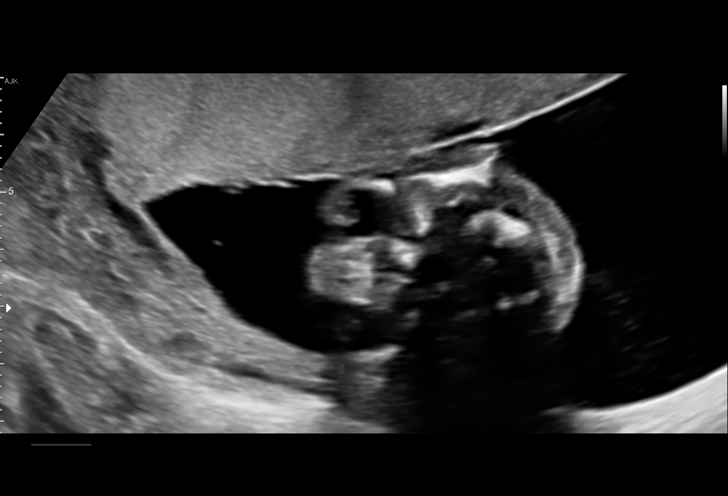
[im 17/89]
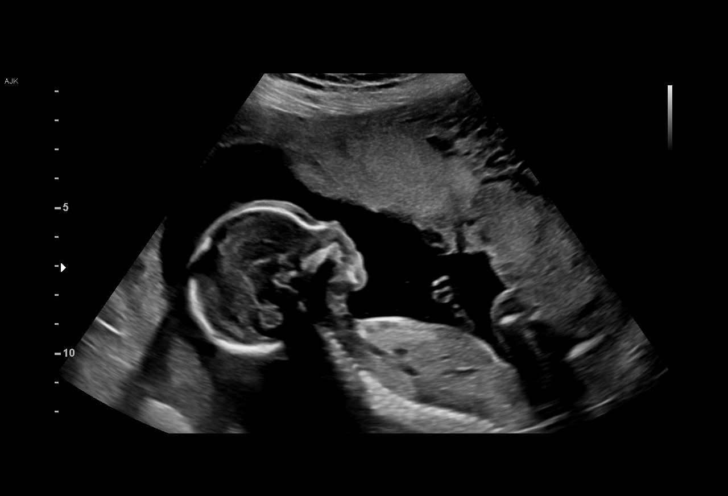
[im 23/89]
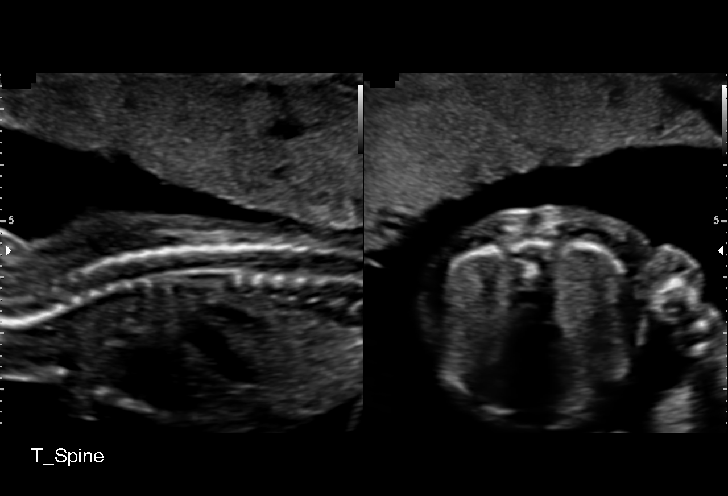
[im 30/89]
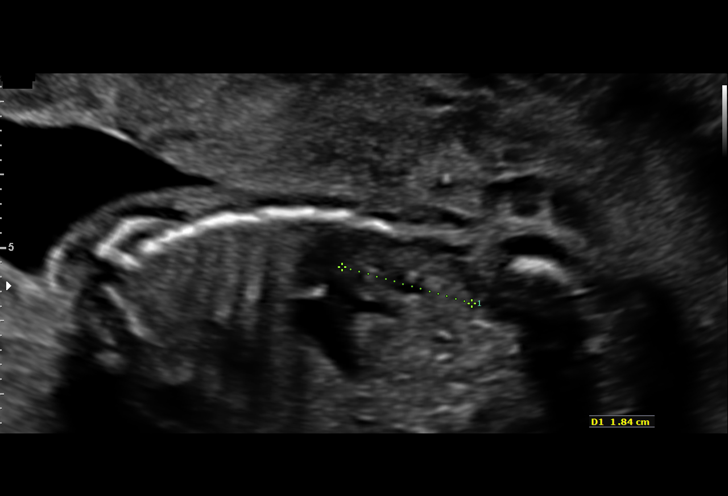
[im 36/89]
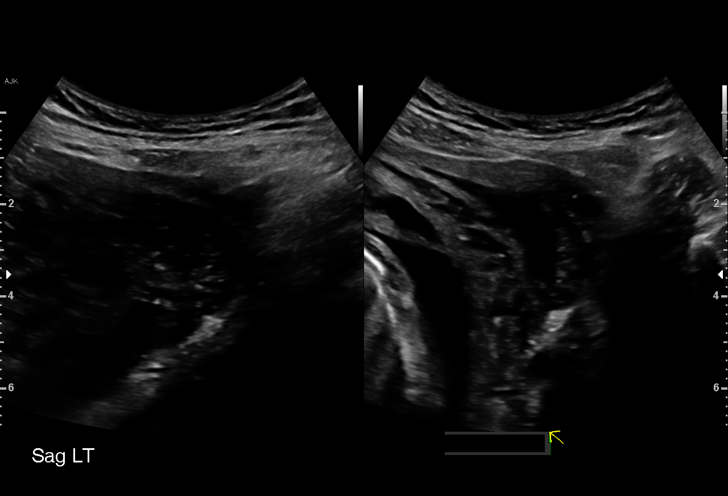
[im 46/89]
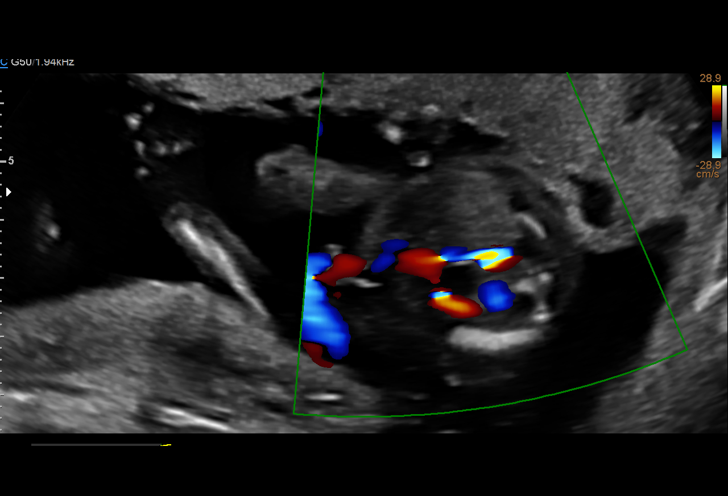
[im 53/89]
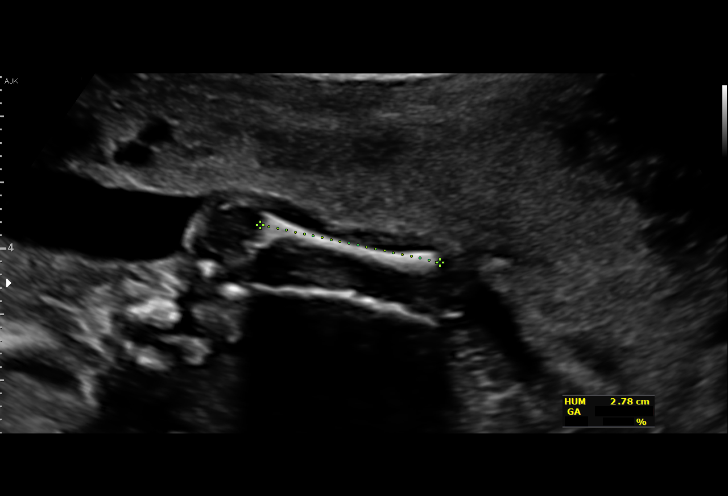
[im 59/89]
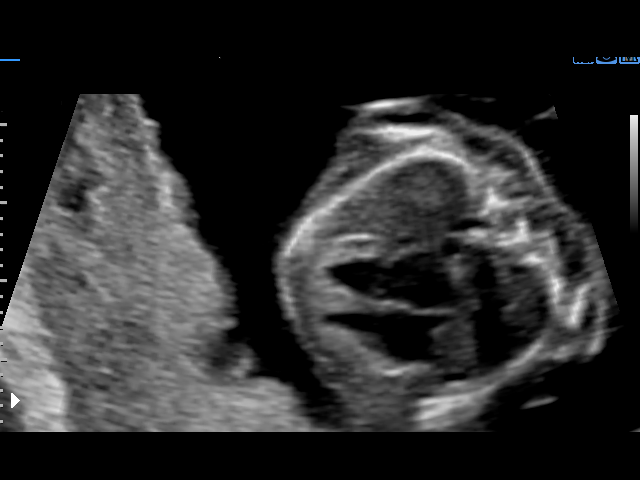
[im 66/89]
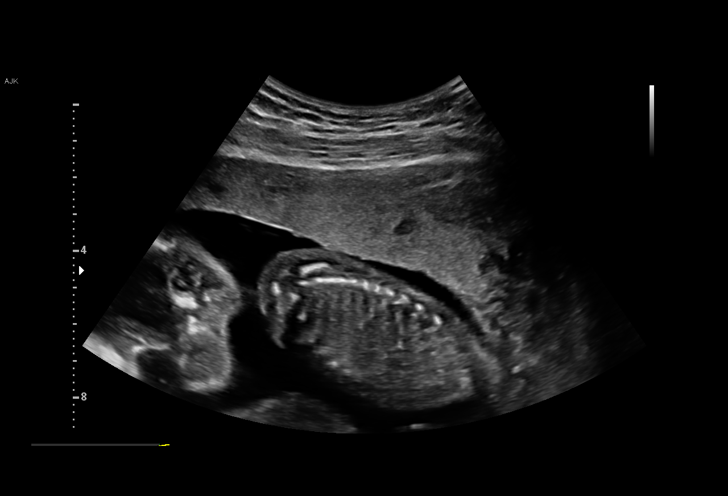
[im 72/89]
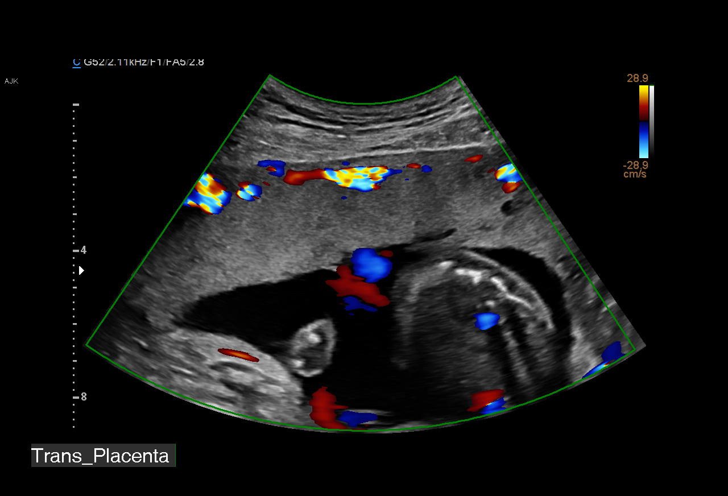
[im 79/89]
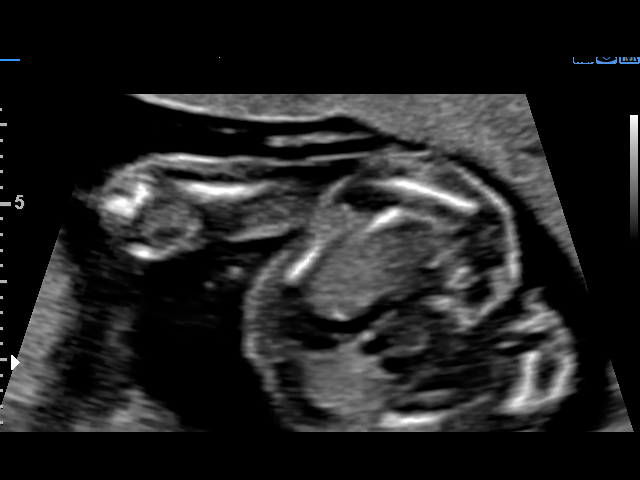
[im 85/89]
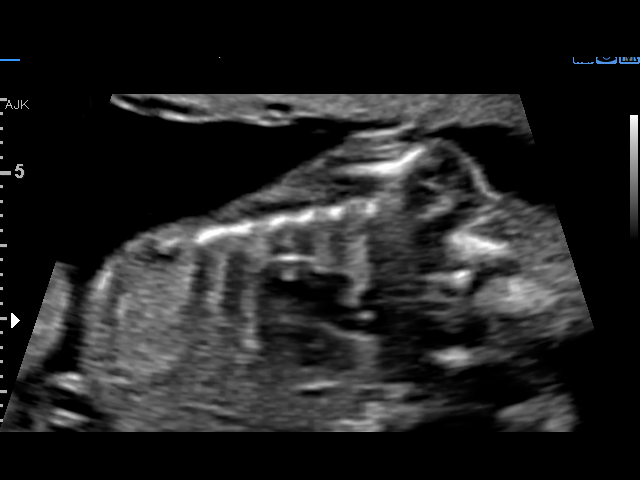

[13 of 28 positions shown; findings below may reference images not displayed]

1  JHONY FERMIN          [id number]      [id number]     [id number]
Indications

19 weeks gestation of pregnancy
Encounter for antenatal screening for
malformations
Medical complication of pregnancy
(polycystic kidney disease)
OB History

Blood Type:            Height:  5'7"   Weight (lb):  156       BMI:
Gravidity:    2         Term:   0        Prem:   0        SAB:   0
TOP:          1       Ectopic:  0        Living: 0
Fetal Evaluation

Num Of Fetuses:     1
Fetal Heart         171
Rate(bpm):
Cardiac Activity:   Observed
Presentation:       Breech

Largest Pocket(cm)
4.17
Biometry

BPD:      40.7  mm     G. Age:  18w 2d          6  %    CI:        73.62   %    70 - 86
FL/HC:      19.6   %    16.8 -
HC:      150.7  mm     G. Age:  18w 1d        < 3  %    HC/AC:      1.02        1.09 -
AC:      147.5  mm     G. Age:  20w 0d         55  %    FL/BPD:     72.5   %
FL:       29.5  mm     G. Age:  19w 1d         22  %    FL/AC:      20.0   %    20 - 24
HUM:      27.7  mm     G. Age:  18w 6d         31  %
CER:      19.2  mm     G. Age:  18w 4d         20  %
NFT:         5  mm
CM:        3.7  mm

Est. FW:     291  gm    0 lb 10 oz      42  %
Gestational Age

LMP:           19w 5d        Date:  06/24/17                 EDD:   03/31/18
U/S Today:     18w 6d                                        EDD:   04/06/18
Best:          19w 5d     Det. By:  LMP  (06/24/17)          EDD:   03/31/18
Anatomy

Cranium:               Appears normal         LVOT:                   Appears normal
Cavum:                 Appears normal         Aortic Arch:            Appears normal
Ventricles:            Appears normal         Ductal Arch:            Appears normal
Choroid Plexus:        Appears normal         Diaphragm:              Appears normal
Cerebellum:            Appears normal         Stomach:                Appears normal, left
sided
Posterior Fossa:       Appears normal         Abdomen:                Appears normal
Nuchal Fold:           Appears normal         Abdominal Wall:         Appears nml (cord
insert, abd wall)
Face:                  Appears normal         Cord Vessels:           Appears normal (3
(orbits and profile)                           vessel cord)
Lips:                  Appears normal         Kidneys:                Appear normal
Palate:                Appears normal         Bladder:                Appears normal
Thoracic:              Appears normal         Spine:                  Appears normal
Heart:                 Appears normal         Upper Extremities:      Appears normal
(4CH, axis, and
situs)
RVOT:                  Appears normal         Lower Extremities:      Appears normal

Other:  Fetus appears to be a male. Heels and feet visualized. Nasal bone
visualized. Technically difficult due to fetal position.
Cervix Uterus Adnexa

Cervix
Length:           3.46  cm.
Normal appearance by transabdominal scan.

Uterus
No abnormality visualized.

Left Ovary
Not visualized.

Right Ovary
Not visualized.

Adnexa:       No abnormality visualized.
Impression

Patient has polycystic kidneys (autosomal dominant). She
reports her mother also has polycystic kidneys.

antihypertensives. She was advised by you to take low-dose
aspirin (for prevention of preeclampsia). Patient was reluctant
and has not started taking aspirin.

We performed a fetal anatomy scan. No markers of
aneuploidies or fetal structural defects are seen. Both
kidneys look normal. Fetal biometry is consistent with her
previously-established dates. Amniotic fluid is normal and
good fetal activity is seen. Patient understands the limitations
of ultrasound in detecting fetal anomalies.

I briefly discussed genetics of polycystic kidneys (50%
transmission) and that prenatal diagnosis (amniocentesis) is
available. I recommended genetic counseling. Patient
declined genetic counseling. I informed the patient that
ultrasound may not identify polycystic kidneys.

I discussed the benefit of low-dose aspirin that can delay or
prevent preeclampsia. Patient does not have
contraindications to aspirin. She informed that she will start
aspirin from today.
Recommendations

-An appointment was made for her to return in 8 weeks for
fetal growth assessment.
-Serial fetal growth assessments from next visit till delivery.

## 2019-07-11 IMAGING — CT CT ANGIO CHEST
1 of 2 series · 19 of 32 positions shown · IV contrast (OMNIPAQUE)
Comparison: None.

CLINICAL DATA: [REDACTED] weeks pregnant. Pain below the right
breast. No shortness of breath.

EXAM:
CT ANGIOGRAPHY CHEST WITH CONTRAST
TECHNIQUE: Multidetector CT imaging of the chest was performed using the
standard protocol during bolus administration of intravenous
contrast. Multiplanar CT image reconstructions and MIPs were
obtained to evaluate the vascular anatomy.
CONTRAST:  100mL 1S06PF-IMS IOPAMIDOL (1S06PF-IMS) INJECTION 76%

[Series 10: (person_name) thins · axial · 0.63mm/px · z∈[-306,-99]mm · 19 of 329 slices shown]
[im 17/329  lung]
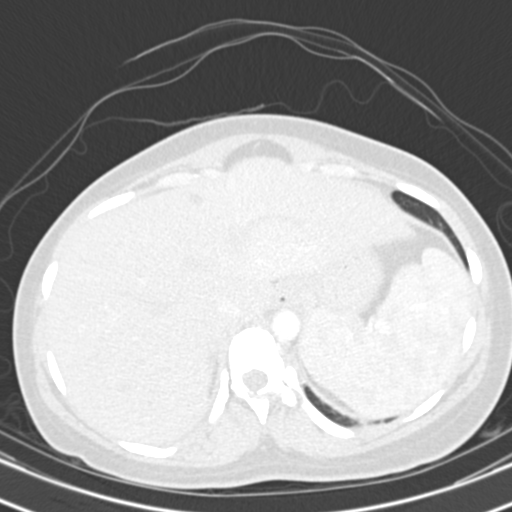
[im 33/329  mediastinal]
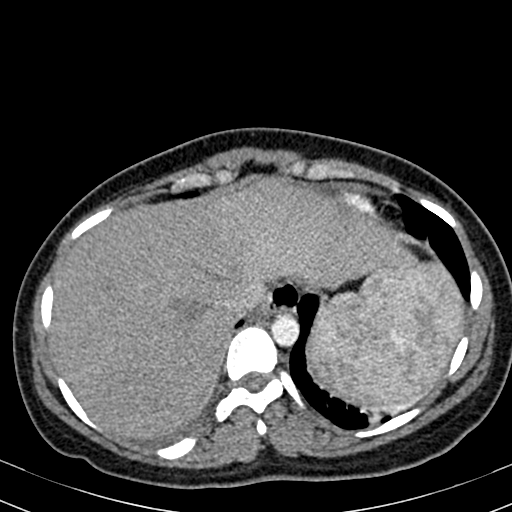
[im 50/329  lung]
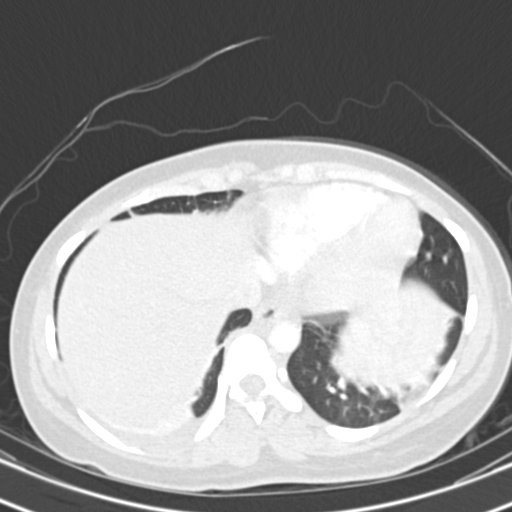
[im 83/329  mediastinal]
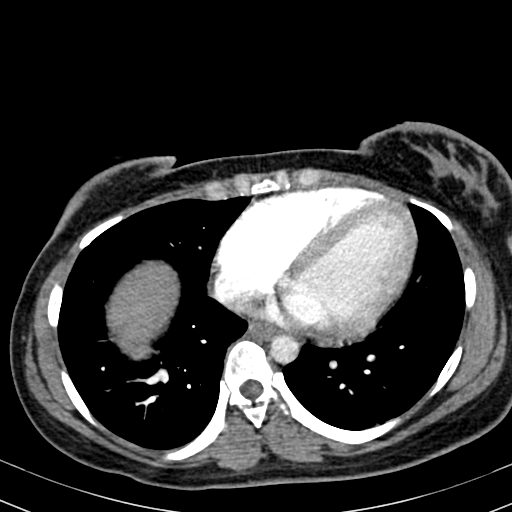
[im 99/329  lung]
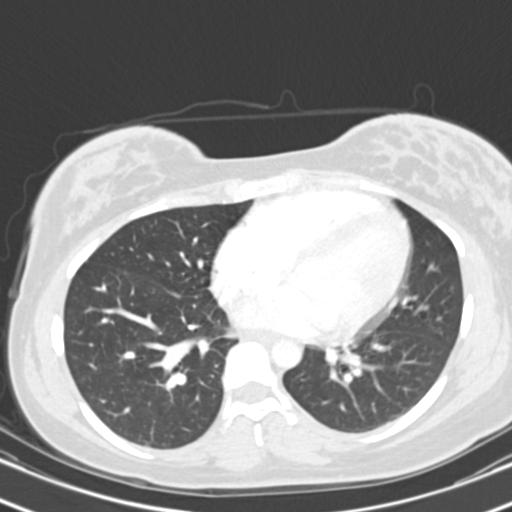
[im 110/329  mediastinal]
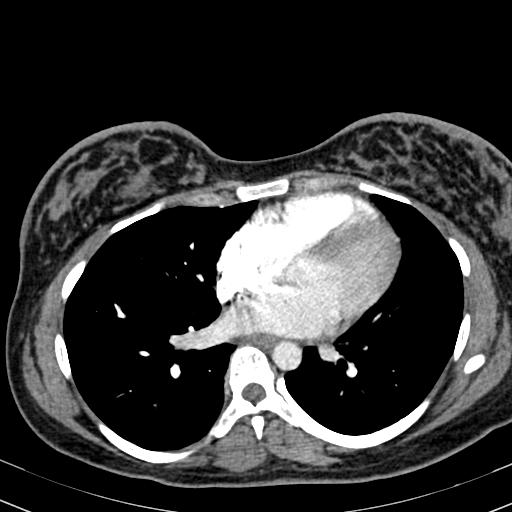
[im 115/329  lung]
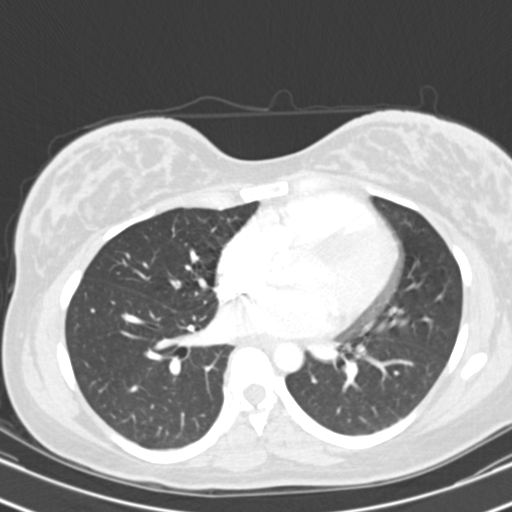
[im 132/329  mediastinal]
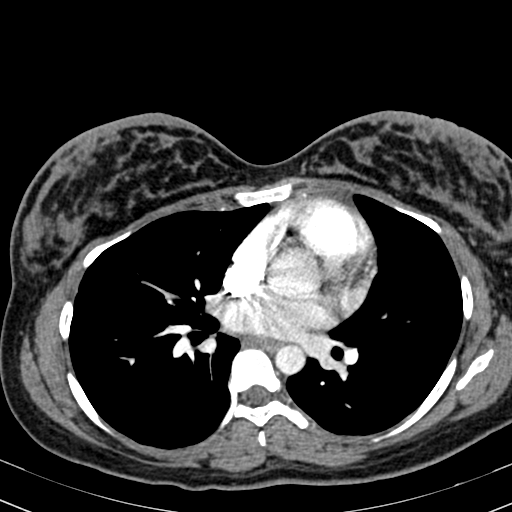
[im 148/329  lung]
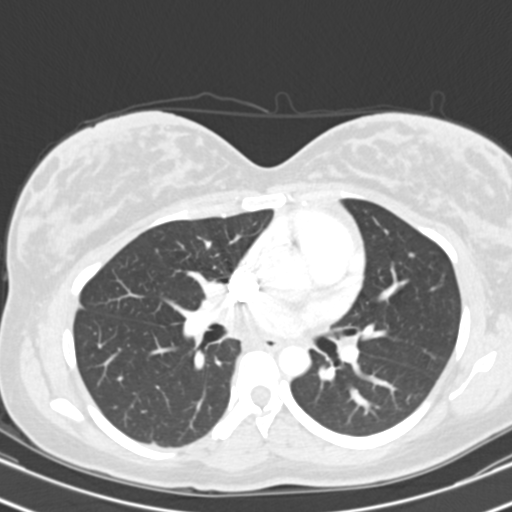
[im 165/329  mediastinal]
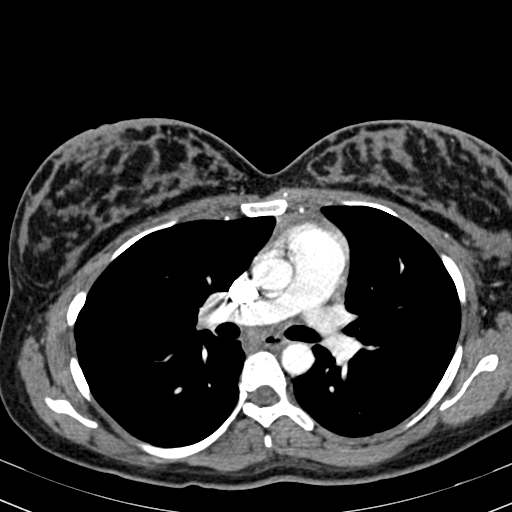
[im 181/329  lung]
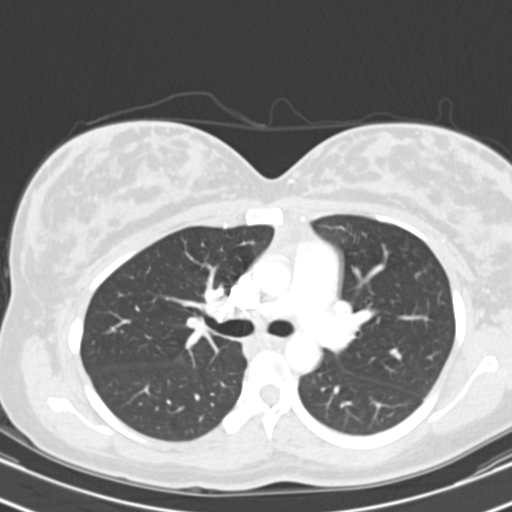
[im 197/329  mediastinal]
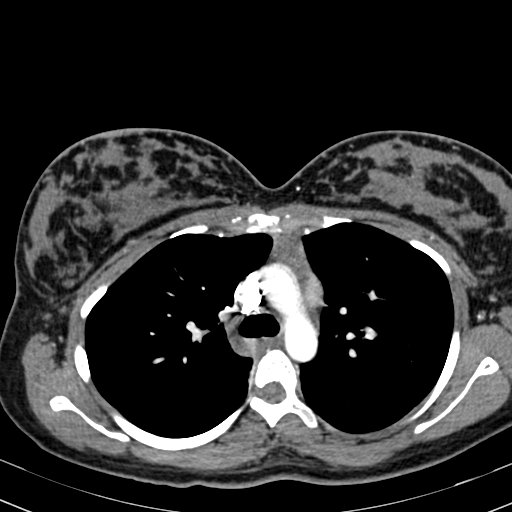
[im 214/329  lung]
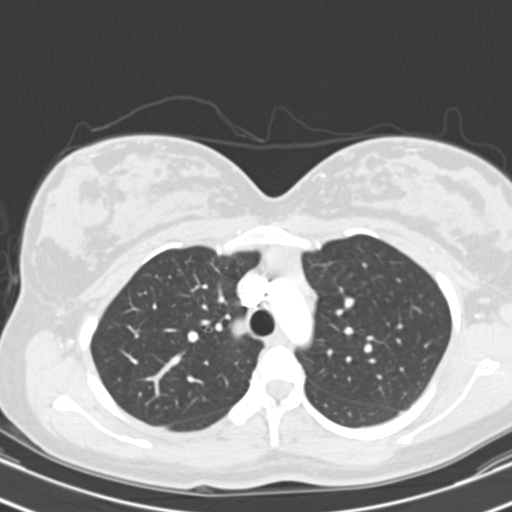
[im 219/329  mediastinal]
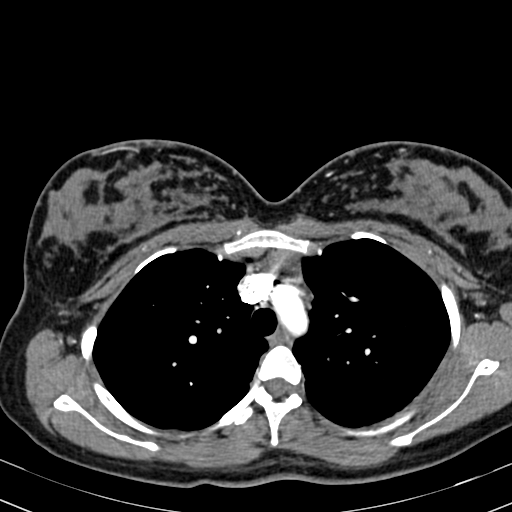
[im 230/329  lung]
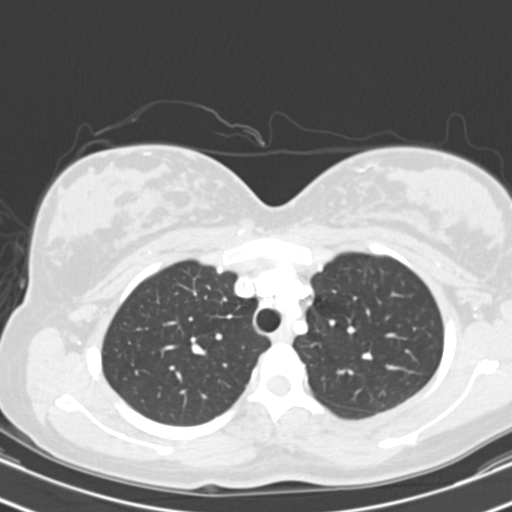
[im 246/329  mediastinal]
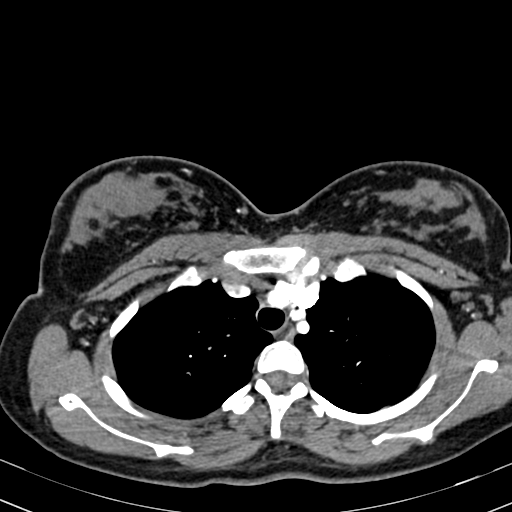
[im 279/329  lung]
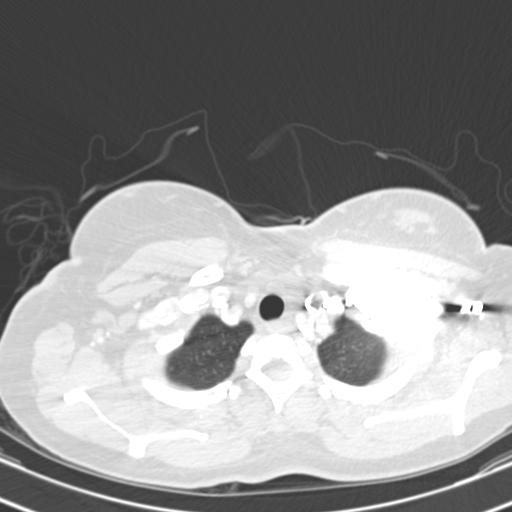
[im 296/329  mediastinal]
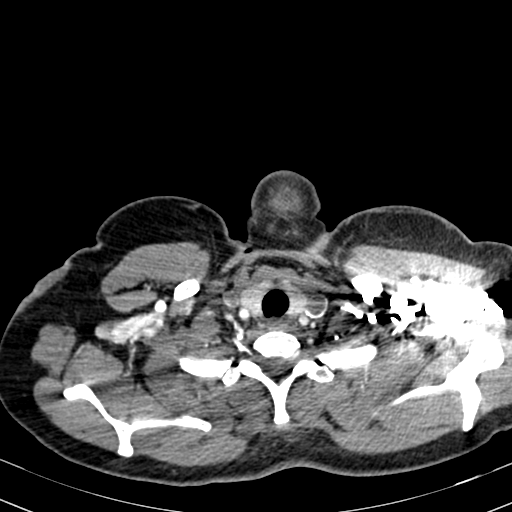
[im 312/329  lung]
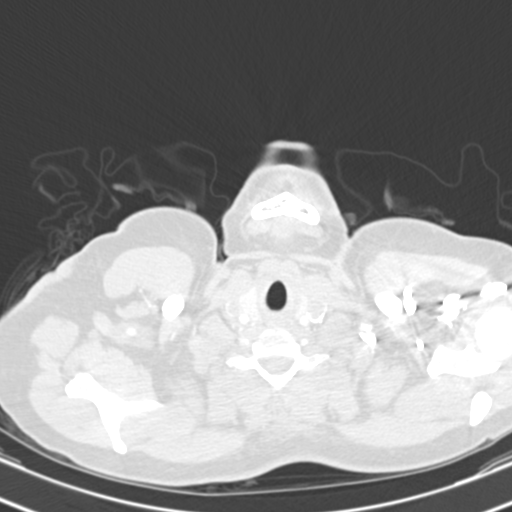

[19 of 32 positions shown; findings below may reference images not displayed]

FINDINGS: Cardiovascular: Contrast opacification of the pulmonary arteries is
slightly less than that of the aorta normal mildly limiting the exam
for the assessment smaller segmental and subsegmental pulmonary
arteries. Allowing for this, there is no evidence of a pulmonary
embolism.

Heart is normal in size and configuration. No pericardial effusion.
Great vessels are within normal limits.

Mediastinum/Nodes: No enlarged mediastinal, hilar, or axillary lymph
nodes. Thyroid gland, trachea, and esophagus demonstrate no
significant findings.

Lungs/Pleura: Minimal right pleural effusion. Bilateral lower lobe
dependent opacity, right greater than left, consistent with
atelectasis. Remainder of the lungs is clear. No left pleural
effusion. No pneumothorax.

Upper Abdomen: No acute findings. Visualized portions of the kidneys
demonstrate multiple cysts. Small cyst in the liver.

Musculoskeletal: Dextroscoliosis of the thoracic spine. No fracture
or acute finding. No bone lesion.

Review of the MIP images confirms the above findings.
IMPRESSION: 1. No evidence of a pulmonary embolism.
2. Minimal right pleural effusion. Right greater than left dependent
lower lobe atelectasis. No evidence of pneumonia or pulmonary edema.
